# Patient Record
Sex: Male | Born: 1950 | Race: White | Hispanic: No | Marital: Married | Smoking: Never smoker
Health system: Southern US, Community
[De-identification: ages and names within clinical notes are randomized; demographics above are authoritative.]

## PROBLEM LIST (undated history)

## (undated) DIAGNOSIS — E7800 Pure hypercholesterolemia, unspecified: Secondary | ICD-10-CM

## (undated) DIAGNOSIS — N400 Enlarged prostate without lower urinary tract symptoms: Secondary | ICD-10-CM

## (undated) DIAGNOSIS — Z86018 Personal history of other benign neoplasm: Secondary | ICD-10-CM

## (undated) DIAGNOSIS — R972 Elevated prostate specific antigen [PSA]: Secondary | ICD-10-CM

## (undated) DIAGNOSIS — Z87442 Personal history of urinary calculi: Secondary | ICD-10-CM

## (undated) DIAGNOSIS — N289 Disorder of kidney and ureter, unspecified: Secondary | ICD-10-CM

## (undated) DIAGNOSIS — C801 Malignant (primary) neoplasm, unspecified: Secondary | ICD-10-CM

## (undated) HISTORY — PX: TONSILLECTOMY: SUR1361

## (undated) HISTORY — DX: Personal history of other benign neoplasm: Z86.018

## (undated) HISTORY — PX: COLONOSCOPY: SHX174

## (undated) HISTORY — PX: WISDOM TOOTH EXTRACTION: SHX21

## (undated) HISTORY — PX: PROSTATE BIOPSY: SHX241

---

## 1991-10-09 HISTORY — PX: BLADDER TUMOR EXCISION: SHX238

## 1998-06-24 ENCOUNTER — Encounter: Admission: RE | Admit: 1998-06-24 | Discharge: 1998-06-24 | Payer: Self-pay | Admitting: Sports Medicine

## 1998-07-11 ENCOUNTER — Encounter: Admission: RE | Admit: 1998-07-11 | Discharge: 1998-07-11 | Payer: Self-pay | Admitting: Sports Medicine

## 1998-08-08 ENCOUNTER — Encounter: Admission: RE | Admit: 1998-08-08 | Discharge: 1998-08-08 | Payer: Self-pay | Admitting: Sports Medicine

## 2004-04-27 ENCOUNTER — Emergency Department (HOSPITAL_COMMUNITY): Admission: EM | Admit: 2004-04-27 | Discharge: 2004-04-27 | Payer: Self-pay | Admitting: Emergency Medicine

## 2004-11-29 ENCOUNTER — Ambulatory Visit: Payer: Self-pay | Admitting: Gastroenterology

## 2005-07-17 ENCOUNTER — Ambulatory Visit: Payer: Self-pay | Admitting: Gastroenterology

## 2005-08-03 ENCOUNTER — Encounter (INDEPENDENT_AMBULATORY_CARE_PROVIDER_SITE_OTHER): Payer: Self-pay | Admitting: Specialist

## 2005-08-03 ENCOUNTER — Ambulatory Visit: Payer: Self-pay | Admitting: Gastroenterology

## 2008-11-23 ENCOUNTER — Encounter: Admission: RE | Admit: 2008-11-23 | Discharge: 2008-11-23 | Payer: Self-pay | Admitting: Orthopedic Surgery

## 2008-12-28 ENCOUNTER — Encounter: Admission: RE | Admit: 2008-12-28 | Discharge: 2008-12-28 | Payer: Self-pay | Admitting: Orthopedic Surgery

## 2009-01-04 ENCOUNTER — Encounter: Admission: RE | Admit: 2009-01-04 | Discharge: 2009-01-04 | Payer: Self-pay | Admitting: Orthopedic Surgery

## 2009-01-26 ENCOUNTER — Encounter: Admission: RE | Admit: 2009-01-26 | Discharge: 2009-01-26 | Payer: Self-pay | Admitting: Interventional Radiology

## 2009-07-06 ENCOUNTER — Encounter: Admission: RE | Admit: 2009-07-06 | Discharge: 2009-07-06 | Payer: Self-pay | Admitting: Interventional Radiology

## 2010-06-07 ENCOUNTER — Encounter (INDEPENDENT_AMBULATORY_CARE_PROVIDER_SITE_OTHER): Payer: Self-pay | Admitting: *Deleted

## 2010-11-09 NOTE — Letter (Signed)
Summary: Colonoscopy Letter  [facility]  [address] [place], [place] [address]   Phone: [phone]  Fax: [phone]      June 07, 2010 MRN: [id number]   Eddie Nunez [address] [place], [place]  [address]   Dear Mr. SEABROOKS,   According to your medical record, it is time for you to schedule a Colonoscopy. The American Cancer Society recommends this procedure as a method to detect early colon cancer. Patients with a family history of colon cancer, or a personal history of colon polyps or inflammatory bowel disease are at increased risk.  This letter has beeen generated based on the recommendations made at the time of your procedure. If you feel that in your particular situation this may no longer apply, please contact our office.  Please call our office at [phone] to schedule this appointment or to update your records at your earliest convenience.  Thank you for cooperating with [place] to provide you with the very best care possible.   Sincerely,  Judie Petit T. Russella Dar, M.D.  [facility] [phone]

## 2012-06-26 ENCOUNTER — Encounter: Payer: Self-pay | Admitting: Gastroenterology

## 2014-10-08 HISTORY — PX: COLONOSCOPY: SHX174

## 2014-10-08 HISTORY — PX: POLYPECTOMY: SHX149

## 2015-06-01 ENCOUNTER — Encounter: Payer: Self-pay | Admitting: Gastroenterology

## 2015-07-28 ENCOUNTER — Ambulatory Visit (AMBULATORY_SURGERY_CENTER): Payer: Self-pay | Admitting: *Deleted

## 2015-07-28 VITALS — Ht 72.0 in | Wt 191.0 lb

## 2015-07-28 DIAGNOSIS — Z86010 Personal history of colonic polyps: Secondary | ICD-10-CM

## 2015-07-28 MED ORDER — NA SULFATE-K SULFATE-MG SULF 17.5-3.13-1.6 GM/177ML PO SOLN: 1.0000 | Freq: Once | ORAL | Status: DC

## 2015-07-28 NOTE — Addendum Note (Signed)
Addended by: Dayton Bailiff D on: 07/28/2015 01:26 PM   Modules accepted: Orders

## 2015-07-28 NOTE — Progress Notes (Signed)
No egg or soy allergy. No anesthesia problems.  No home O2.  No diet meds.  

## 2015-07-29 ENCOUNTER — Encounter: Payer: Self-pay | Admitting: Gastroenterology

## 2015-07-29 ENCOUNTER — Telehealth: Payer: Self-pay | Admitting: Gastroenterology

## 2015-07-29 NOTE — Telephone Encounter (Signed)
Patient notified that there may be a problem with the server at the time he tired and that he should try at a later date and time.  I assured him that the website is active

## 2015-08-11 ENCOUNTER — Ambulatory Visit (AMBULATORY_SURGERY_CENTER): Admitting: Gastroenterology

## 2015-08-11 ENCOUNTER — Encounter: Payer: Self-pay | Admitting: Gastroenterology

## 2015-08-11 VITALS — BP 103/59 | HR 55 | Temp 96.2°F | Resp 12 | Ht 72.0 in | Wt 191.0 lb

## 2015-08-11 DIAGNOSIS — D123 Benign neoplasm of transverse colon: Secondary | ICD-10-CM

## 2015-08-11 DIAGNOSIS — D125 Benign neoplasm of sigmoid colon: Secondary | ICD-10-CM

## 2015-08-11 DIAGNOSIS — Z860100 Personal history of colon polyps, unspecified: Secondary | ICD-10-CM

## 2015-08-11 MED ORDER — SODIUM CHLORIDE 0.9 % IV SOLN: 500.0000 mL | INTRAVENOUS | Status: DC

## 2015-08-11 NOTE — Progress Notes (Signed)
A/ox3 pleased with MAC, report to Jane RN 

## 2015-08-11 NOTE — Progress Notes (Signed)
Called to room to assist during endoscopic procedure.  Patient ID and intended procedure confirmed with present staff. Received instructions for my participation in the procedure from the performing physician.  

## 2015-08-11 NOTE — Patient Instructions (Signed)
YOU HAD AN ENDOSCOPIC PROCEDURE TODAY AT THE [facility]:   Refer to the procedure report that was given to you for any specific questions about what was found during the examination.  If the procedure report does not answer your questions, please call your gastroenterologist to clarify.  If you requested that your care partner not be given the details of your procedure findings, then the procedure report has been included in a sealed envelope for you to review at your convenience later.  YOU SHOULD EXPECT: Some feelings of bloating in the abdomen. Passage of more gas than usual.  Walking can help get rid of the air that was put into your GI tract during the procedure and reduce the bloating. If you had a lower endoscopy (such as a colonoscopy or flexible sigmoidoscopy) you may notice spotting of blood in your stool or on the toilet paper. If you underwent a bowel prep for your procedure, you may not have a normal bowel movement for a few days.  Please Note:  You might notice some irritation and congestion in your nose or some drainage.  This is from the oxygen used during your procedure.  There is no need for concern and it should clear up in a day or so.  SYMPTOMS TO REPORT IMMEDIATELY:   Following lower endoscopy (colonoscopy or flexible sigmoidoscopy):  Excessive amounts of blood in the stool  Significant tenderness or worsening of abdominal pains  Swelling of the abdomen that is new, acute  Fever of 100F or higher   For urgent or emergent issues, a gastroenterologist can be reached at any hour by calling [phone].   DIET: Your first meal following the procedure should be a small meal and then it is ok to progress to your normal diet. Heavy or fried foods are harder to digest and may make you feel nauseous or bloated.  Likewise, meals heavy in dairy and vegetables can increase bloating.  Drink plenty of fluids but you should avoid alcoholic beverages for 24  hours.  ACTIVITY:  You should plan to take it easy for the rest of today and you should NOT DRIVE or use heavy machinery until tomorrow (because of the sedation medicines used during the test).    FOLLOW UP: Our staff will call the number listed on your records the next business day following your procedure to check on you and address any questions or concerns that you may have regarding the information given to you following your procedure. If we do not reach you, we will leave a message.  However, if you are feeling well and you are not experiencing any problems, there is no need to return our call.  We will assume that you have returned to your regular daily activities without incident.  If any biopsies were taken you will be contacted by phone or by letter within the next 1-3 weeks.  Please call us at [phone] if you have not heard about the biopsies in 3 weeks.    SIGNATURES/CONFIDENTIALITY: You and/or your care partner have signed paperwork which will be entered into your electronic medical record.  These signatures attest to the fact that that the information above on your After Visit Summary has been reviewed and is understood.  Full responsibility of the confidentiality of this discharge information lies with you and/or your care-partner.  Polyp and hemorrhoid information given. 

## 2015-08-11 NOTE — Op Note (Signed)
[facility]  [address] [place], [address]   COLONOSCOPY PROCEDURE REPORT  PATIENT: Eddie Nunez, Eddie Nunez  MR#: [id number] BIRTHDATE: 1950-12-08 , 59  yrs. old GENDER: male ENDOSCOPIST: Ladene Artist, MD, [facility] REFERRED BY:  Burnard Bunting, M.D. PROCEDURE DATE:  08/11/2015 PROCEDURE:   Colonoscopy, surveillance and Colonoscopy with snare polypectomy First Screening Colonoscopy - Avg.  risk and is 50 yrs.  old or older - No.  Prior Negative Screening - Now for repeat screening. N/A  History of Adenoma - Now for follow-up colonoscopy & has been > or = to 3 yrs.  Yes hx of adenoma.  Has been 3 or more years since last colonoscopy.  Polyps removed today? Yes ASA CLASS:   Class II INDICATIONS:Surveillance due to prior colonic neoplasia and PH Colon Adenoma. MEDICATIONS: Monitored anesthesia care and Propofol 250 mg IV DESCRIPTION OF PROCEDURE:   After the risks benefits and alternatives of the procedure were thoroughly explained, informed consent was obtained.  The digital rectal exam revealed no abnormalities of the rectum.   The [id number]  endoscope was introduced through the anus and advanced to the cecum, which was identified by both the appendix and ileocecal valve. No adverse events experienced.   The quality of the prep was excellent. (Suprep was used)  The instrument was then slowly withdrawn as the colon was fully examined. Estimated blood loss is zero unless otherwise noted in this procedure report.    COLON FINDINGS: Two sessile polyps measuring 6 mm in size were found in the sigmoid colon and transverse colon.  Polypectomies were performed with a cold snare.  The resection was complete, the polyp tissue was completely retrieved and sent to histology.   The examination was otherwise normal.  Retroflexed views revealed internal Grade I hemorrhoids. The time to cecum = 2.0 Withdrawal time = 11.6   The scope was withdrawn and the procedure  completed. COMPLICATIONS: There were no immediate complications.  ENDOSCOPIC IMPRESSION: 1.   Two sessile polyps in the sigmoid colon and transverse colon; polypectomies performed with a cold snare 2.   Grade I internal hemorrhoids  RECOMMENDATIONS: 1.  Await pathology results 2.  Repeat colonoscopy in 5 years if polyp(s) adenomatous; otherwise 10 years  eSigned:  Ladene Artist, MD, [facility] 08/11/2015 9:02 AM

## 2015-08-12 ENCOUNTER — Telehealth: Payer: Self-pay

## 2015-08-12 NOTE — Telephone Encounter (Signed)
  Follow up Call-  Call back number 08/11/2015  Post procedure Call Back phone  # [phone] hm  Permission to leave phone message Yes     Patient questions:  Do you have a fever, pain , or abdominal swelling? No. Pain Score  0 *  Have you tolerated food without any problems? Yes.    Have you been able to return to your normal activities? Yes.    Do you have any questions about your discharge instructions: Diet   No. Medications  No. Follow up visit  No.  Do you have questions or concerns about your Care? No.  Actions: * If pain score is 4 or above: No action needed, pain <4.

## 2015-08-18 ENCOUNTER — Encounter: Payer: Self-pay | Admitting: Gastroenterology

## 2016-05-28 DIAGNOSIS — Z0000 Encounter for general adult medical examination without abnormal findings: Secondary | ICD-10-CM | POA: Diagnosis not present

## 2016-05-28 DIAGNOSIS — Z125 Encounter for screening for malignant neoplasm of prostate: Secondary | ICD-10-CM | POA: Diagnosis not present

## 2016-05-28 DIAGNOSIS — E784 Other hyperlipidemia: Secondary | ICD-10-CM | POA: Diagnosis not present

## 2016-05-29 DIAGNOSIS — Z1212 Encounter for screening for malignant neoplasm of rectum: Secondary | ICD-10-CM | POA: Diagnosis not present

## 2016-05-30 DIAGNOSIS — Z6826 Body mass index (BMI) 26.0-26.9, adult: Secondary | ICD-10-CM | POA: Diagnosis not present

## 2016-05-30 DIAGNOSIS — Z0000 Encounter for general adult medical examination without abnormal findings: Secondary | ICD-10-CM | POA: Diagnosis not present

## 2016-05-30 DIAGNOSIS — E663 Overweight: Secondary | ICD-10-CM | POA: Diagnosis not present

## 2016-05-30 DIAGNOSIS — Z1389 Encounter for screening for other disorder: Secondary | ICD-10-CM | POA: Diagnosis not present

## 2016-05-30 DIAGNOSIS — E784 Other hyperlipidemia: Secondary | ICD-10-CM | POA: Diagnosis not present

## 2017-02-18 DIAGNOSIS — R05 Cough: Secondary | ICD-10-CM | POA: Diagnosis not present

## 2017-05-29 DIAGNOSIS — E784 Other hyperlipidemia: Secondary | ICD-10-CM | POA: Diagnosis not present

## 2017-05-29 DIAGNOSIS — R7989 Other specified abnormal findings of blood chemistry: Secondary | ICD-10-CM | POA: Diagnosis not present

## 2017-05-29 DIAGNOSIS — Z125 Encounter for screening for malignant neoplasm of prostate: Secondary | ICD-10-CM | POA: Diagnosis not present

## 2017-05-31 DIAGNOSIS — Z1212 Encounter for screening for malignant neoplasm of rectum: Secondary | ICD-10-CM | POA: Diagnosis not present

## 2017-06-05 DIAGNOSIS — Z23 Encounter for immunization: Secondary | ICD-10-CM | POA: Diagnosis not present

## 2017-06-05 DIAGNOSIS — E784 Other hyperlipidemia: Secondary | ICD-10-CM | POA: Diagnosis not present

## 2017-06-05 DIAGNOSIS — Z6827 Body mass index (BMI) 27.0-27.9, adult: Secondary | ICD-10-CM | POA: Diagnosis not present

## 2017-06-05 DIAGNOSIS — E663 Overweight: Secondary | ICD-10-CM | POA: Diagnosis not present

## 2017-06-05 DIAGNOSIS — Z0000 Encounter for general adult medical examination without abnormal findings: Secondary | ICD-10-CM | POA: Diagnosis not present

## 2017-11-11 DIAGNOSIS — N390 Urinary tract infection, site not specified: Secondary | ICD-10-CM | POA: Diagnosis not present

## 2017-11-11 DIAGNOSIS — R350 Frequency of micturition: Secondary | ICD-10-CM | POA: Diagnosis not present

## 2017-11-11 DIAGNOSIS — Z6828 Body mass index (BMI) 28.0-28.9, adult: Secondary | ICD-10-CM | POA: Diagnosis not present

## 2018-02-13 DIAGNOSIS — L821 Other seborrheic keratosis: Secondary | ICD-10-CM | POA: Diagnosis not present

## 2018-02-13 DIAGNOSIS — L720 Epidermal cyst: Secondary | ICD-10-CM | POA: Diagnosis not present

## 2018-06-04 DIAGNOSIS — R82998 Other abnormal findings in urine: Secondary | ICD-10-CM | POA: Diagnosis not present

## 2018-06-04 DIAGNOSIS — E7849 Other hyperlipidemia: Secondary | ICD-10-CM | POA: Diagnosis not present

## 2018-06-04 DIAGNOSIS — Z125 Encounter for screening for malignant neoplasm of prostate: Secondary | ICD-10-CM | POA: Diagnosis not present

## 2018-06-06 DIAGNOSIS — Z1212 Encounter for screening for malignant neoplasm of rectum: Secondary | ICD-10-CM | POA: Diagnosis not present

## 2018-06-11 DIAGNOSIS — Z1389 Encounter for screening for other disorder: Secondary | ICD-10-CM | POA: Diagnosis not present

## 2018-06-11 DIAGNOSIS — Z23 Encounter for immunization: Secondary | ICD-10-CM | POA: Diagnosis not present

## 2018-06-11 DIAGNOSIS — Z0000 Encounter for general adult medical examination without abnormal findings: Secondary | ICD-10-CM | POA: Diagnosis not present

## 2018-06-11 DIAGNOSIS — E785 Hyperlipidemia, unspecified: Secondary | ICD-10-CM | POA: Diagnosis not present

## 2018-06-11 DIAGNOSIS — E663 Overweight: Secondary | ICD-10-CM | POA: Diagnosis not present

## 2018-06-11 DIAGNOSIS — Z6828 Body mass index (BMI) 28.0-28.9, adult: Secondary | ICD-10-CM | POA: Diagnosis not present

## 2019-05-28 ENCOUNTER — Other Ambulatory Visit: Payer: Self-pay

## 2019-05-28 DIAGNOSIS — Z20822 Contact with and (suspected) exposure to covid-19: Secondary | ICD-10-CM

## 2019-05-28 DIAGNOSIS — R6889 Other general symptoms and signs: Secondary | ICD-10-CM | POA: Diagnosis not present

## 2019-05-29 LAB — NOVEL CORONAVIRUS, NAA: SARS-CoV-2, NAA: NOT DETECTED

## 2019-06-17 DIAGNOSIS — R82998 Other abnormal findings in urine: Secondary | ICD-10-CM | POA: Diagnosis not present

## 2019-06-17 DIAGNOSIS — Z125 Encounter for screening for malignant neoplasm of prostate: Secondary | ICD-10-CM | POA: Diagnosis not present

## 2019-06-17 DIAGNOSIS — E7849 Other hyperlipidemia: Secondary | ICD-10-CM | POA: Diagnosis not present

## 2019-06-24 DIAGNOSIS — Z0000 Encounter for general adult medical examination without abnormal findings: Secondary | ICD-10-CM | POA: Diagnosis not present

## 2019-06-24 DIAGNOSIS — E663 Overweight: Secondary | ICD-10-CM | POA: Diagnosis not present

## 2019-06-24 DIAGNOSIS — E785 Hyperlipidemia, unspecified: Secondary | ICD-10-CM | POA: Diagnosis not present

## 2019-10-30 ENCOUNTER — Ambulatory Visit: Payer: TRICARE For Life (TFL) | Attending: Internal Medicine

## 2019-10-30 DIAGNOSIS — Z23 Encounter for immunization: Secondary | ICD-10-CM

## 2019-10-30 NOTE — Progress Notes (Signed)
   Covid-19 Vaccination Clinic  Name:  Eddie Nunez    MRN: [id number] DOB: 02-28-1951  10/30/2019  [name] was observed post Covid-19 immunization for 15 minutes without incidence. He was provided with Vaccine Information Sheet and instruction to access the V-Safe system.   [name] was instructed to call 911 with any severe reactions post vaccine: Marland Kitchen Difficulty breathing  . Swelling of your face and throat  . A fast heartbeat  . A bad rash all over your body  . Dizziness and weakness    Immunizations Administered    Name Date Dose VIS Date Route   Pfizer COVID-19 Vaccine 10/30/2019  1:24 PM 0.3 mL 09/18/2019 Intramuscular   Manufacturer: [place]   Lot: [id number]   [place]: [id number]

## 2019-11-20 ENCOUNTER — Ambulatory Visit: Payer: Medicare Other | Attending: Internal Medicine

## 2019-11-20 DIAGNOSIS — Z23 Encounter for immunization: Secondary | ICD-10-CM

## 2019-11-20 NOTE — Progress Notes (Signed)
   Covid-19 Vaccination Clinic  Name:  Eddie Nunez    MRN: [id number] DOB: 02-May-1951  11/20/2019  Eddie Nunez was observed post Covid-19 immunization for 15 minutes without incidence. He was provided with Vaccine Information Sheet and instruction to access the V-Safe system.   Eddie Nunez was instructed to call 911 with any severe reactions post vaccine: Marland Kitchen Difficulty breathing  . Swelling of your face and throat  . A fast heartbeat  . A bad rash all over your body  . Dizziness and weakness    Immunizations Administered    Name Date Dose VIS Date Route   Pfizer COVID-19 Vaccine 11/20/2019 12:19 PM 0.3 mL 09/18/2019 Intramuscular   Manufacturer: [place]   Lot: [id number]   [place]: [id number]

## 2020-01-29 DIAGNOSIS — H2513 Age-related nuclear cataract, bilateral: Secondary | ICD-10-CM | POA: Diagnosis not present

## 2020-06-20 DIAGNOSIS — Z125 Encounter for screening for malignant neoplasm of prostate: Secondary | ICD-10-CM | POA: Diagnosis not present

## 2020-06-20 DIAGNOSIS — E785 Hyperlipidemia, unspecified: Secondary | ICD-10-CM | POA: Diagnosis not present

## 2020-06-20 DIAGNOSIS — Z1212 Encounter for screening for malignant neoplasm of rectum: Secondary | ICD-10-CM | POA: Diagnosis not present

## 2020-06-20 DIAGNOSIS — R82998 Other abnormal findings in urine: Secondary | ICD-10-CM | POA: Diagnosis not present

## 2020-06-29 ENCOUNTER — Encounter: Payer: Self-pay | Admitting: Gastroenterology

## 2020-06-29 DIAGNOSIS — E785 Hyperlipidemia, unspecified: Secondary | ICD-10-CM | POA: Diagnosis not present

## 2020-06-29 DIAGNOSIS — Z0000 Encounter for general adult medical examination without abnormal findings: Secondary | ICD-10-CM | POA: Diagnosis not present

## 2020-06-29 DIAGNOSIS — R972 Elevated prostate specific antigen [PSA]: Secondary | ICD-10-CM | POA: Diagnosis not present

## 2020-06-29 DIAGNOSIS — E663 Overweight: Secondary | ICD-10-CM | POA: Diagnosis not present

## 2020-07-06 DIAGNOSIS — L918 Other hypertrophic disorders of the skin: Secondary | ICD-10-CM | POA: Diagnosis not present

## 2020-07-12 DIAGNOSIS — Z23 Encounter for immunization: Secondary | ICD-10-CM | POA: Diagnosis not present

## 2020-10-14 DIAGNOSIS — R972 Elevated prostate specific antigen [PSA]: Secondary | ICD-10-CM | POA: Diagnosis not present

## 2020-10-14 DIAGNOSIS — E785 Hyperlipidemia, unspecified: Secondary | ICD-10-CM | POA: Diagnosis not present

## 2020-11-16 ENCOUNTER — Encounter: Payer: Self-pay | Admitting: Gastroenterology

## 2020-11-23 ENCOUNTER — Encounter: Payer: Self-pay | Admitting: Gastroenterology

## 2021-01-04 ENCOUNTER — Ambulatory Visit (AMBULATORY_SURGERY_CENTER): Payer: Self-pay

## 2021-01-04 ENCOUNTER — Other Ambulatory Visit: Payer: Self-pay

## 2021-01-04 VITALS — Ht 72.0 in | Wt 197.0 lb

## 2021-01-04 DIAGNOSIS — Z86010 Personal history of colonic polyps: Secondary | ICD-10-CM

## 2021-01-04 DIAGNOSIS — Z1211 Encounter for screening for malignant neoplasm of colon: Secondary | ICD-10-CM

## 2021-01-04 MED ORDER — NA SULFATE-K SULFATE-MG SULF 17.5-3.13-1.6 GM/177ML PO SOLN: 1.0000 | Freq: Once | ORAL | 0 refills | Status: AC

## 2021-01-04 NOTE — Progress Notes (Signed)
No egg or soy allergy known to patient  No issues with past sedation with any surgeries or procedures Patient denies ever being told they had issues or difficulty with intubation  No FH of Malignant Hyperthermia No diet pills per patient No home 02 use per patient  No blood thinners per patient  Pt denies issues with constipation  No A fib or A flutter  COVID 19 guidelines implemented in PV today with Pt and RN  Pt is fully vaccinated for Covid x 2; NO PA's for preps discussed with pt In PV today  Discussed with pt there will be an out-of-pocket cost for prep and that varies from $0 to 70 dollars  Due to the COVID-19 pandemic we are asking patients to follow certain guidelines.  Pt aware of COVID protocols and [facility] guidelines

## 2021-01-06 DIAGNOSIS — E785 Hyperlipidemia, unspecified: Secondary | ICD-10-CM | POA: Diagnosis not present

## 2021-01-27 ENCOUNTER — Encounter: Payer: Self-pay | Admitting: Gastroenterology

## 2021-01-27 ENCOUNTER — Ambulatory Visit (AMBULATORY_SURGERY_CENTER): Payer: Medicare Other | Admitting: Gastroenterology

## 2021-01-27 ENCOUNTER — Other Ambulatory Visit: Payer: Self-pay

## 2021-01-27 VITALS — BP 107/65 | HR 56 | Temp 97.7°F | Resp 15 | Ht 72.0 in | Wt 197.0 lb

## 2021-01-27 DIAGNOSIS — Z86010 Personal history of colonic polyps: Secondary | ICD-10-CM

## 2021-01-27 DIAGNOSIS — D124 Benign neoplasm of descending colon: Secondary | ICD-10-CM

## 2021-01-27 DIAGNOSIS — D123 Benign neoplasm of transverse colon: Secondary | ICD-10-CM | POA: Diagnosis not present

## 2021-01-27 DIAGNOSIS — D120 Benign neoplasm of cecum: Secondary | ICD-10-CM | POA: Diagnosis not present

## 2021-01-27 MED ORDER — SODIUM CHLORIDE 0.9 % IV SOLN: 500.0000 mL | Freq: Once | INTRAVENOUS | Status: DC

## 2021-01-27 NOTE — Progress Notes (Signed)
Called to room to assist during endoscopic procedure.  Patient ID and intended procedure confirmed with present staff. Received instructions for my participation in the procedure from the performing physician.  

## 2021-01-27 NOTE — Op Note (Signed)
[place] Patient Name: Eddie Nunez Procedure Date: 01/27/2021 8:36 AM MRN: [id number] Endoscopist: Ladene Artist , MD Age: 70 Referring MD:  Date of Birth: 12-18-1950 Gender: Male Account #: [id number] Procedure:                Colonoscopy Indications:              Surveillance: Personal history of adenomatous                            polyps on last colonoscopy > 5 years ago Medicines:                Monitored Anesthesia Care Procedure:                Pre-Anesthesia Assessment:                           - Prior to the procedure, a History and Physical                            was performed, and patient medications and                            allergies were reviewed. The patient's tolerance of                            previous anesthesia was also reviewed. The risks                            and benefits of the procedure and the sedation                            options and risks were discussed with the patient.                            All questions were answered, and informed consent                            was obtained. Prior Anticoagulants: The patient has                            taken no previous anticoagulant or antiplatelet                            agents. ASA Grade Assessment: II - A patient with                            mild systemic disease. After reviewing the risks                            and benefits, the patient was deemed in                            satisfactory condition to undergo the procedure.  After obtaining informed consent, the colonoscope                            was passed under direct vision. Throughout the                            procedure, the patient's blood pressure, pulse, and                            oxygen saturations were monitored continuously. The                            Colonoscope was introduced through the anus and                            advanced to the the cecum,  identified by                            appendiceal orifice and ileocecal valve. The                            ileocecal valve, appendiceal orifice, and rectum                            were photographed. The quality of the bowel                            preparation was excellent. The colonoscopy was                            performed without difficulty. The patient tolerated                            the procedure well. Scope In: 8:44:45 AM Scope Out: 8:59:24 AM Scope Withdrawal Time: 0 hours 13 minutes 2 seconds  Total Procedure Duration: 0 hours 14 minutes 39 seconds  Findings:                 The perianal and digital rectal examinations were                            normal.                           Six sessile polyps were found in the descending                            colon (2), transverse colon (3) and cecum (1). The                            polyps were 5 to 8 mm in size. These polyps were                            removed with a cold snare. Resection and retrieval  were complete.                           Internal hemorrhoids were found during                            retroflexion. The hemorrhoids were small and Grade                            I (internal hemorrhoids that do not prolapse).                           The exam was otherwise without abnormality on                            direct and retroflexion views. Complications:            No immediate complications. Estimated blood loss:                            None. Estimated Blood Loss:     Estimated blood loss: none. Impression:               - Six 5 to 8 mm polyps in the descending colon, in                            the transverse colon and in the cecum, removed with                            a cold snare. Resected and retrieved.                           - Internal hemorrhoids.                           - The examination was otherwise normal on direct                             and retroflexion views. Recommendation:           - Repeat colonoscopy after studies are complete for                            surveillance based on pathology results.                           - Patient has a contact number available for                            emergencies. The signs and symptoms of potential                            delayed complications were discussed with the                            patient. Return to normal activities tomorrow.  Written discharge instructions were provided to the                            patient.                           - Resume previous diet.                           - Continue present medications.                           - Await pathology results. Ladene Artist, MD 01/27/2021 9:04:02 AM This report has been signed electronically.

## 2021-01-27 NOTE — Progress Notes (Signed)
To PACU, VSS. Report to [email or web address] 

## 2021-01-27 NOTE — Patient Instructions (Signed)
Discharge instructions given. Handouts on polyps and hemorrhoids. Resume previous medications. YOU HAD AN ENDOSCOPIC PROCEDURE TODAY AT THE [facility]:   Refer to the procedure report that was given to you for any specific questions about what was found during the examination.  If the procedure report does not answer your questions, please call your gastroenterologist to clarify.  If you requested that your care partner not be given the details of your procedure findings, then the procedure report has been included in a sealed envelope for you to review at your convenience later.  YOU SHOULD EXPECT: Some feelings of bloating in the abdomen. Passage of more gas than usual.  Walking can help get rid of the air that was put into your GI tract during the procedure and reduce the bloating. If you had a lower endoscopy (such as a colonoscopy or flexible sigmoidoscopy) you may notice spotting of blood in your stool or on the toilet paper. If you underwent a bowel prep for your procedure, you may not have a normal bowel movement for a few days.  Please Note:  You might notice some irritation and congestion in your nose or some drainage.  This is from the oxygen used during your procedure.  There is no need for concern and it should clear up in a day or so.  SYMPTOMS TO REPORT IMMEDIATELY:  Following lower endoscopy (colonoscopy or flexible sigmoidoscopy):  Excessive amounts of blood in the stool  Significant tenderness or worsening of abdominal pains  Swelling of the abdomen that is new, acute  Fever of 100F or higher   For urgent or emergent issues, a gastroenterologist can be reached at any hour by calling [phone]. Do not use MyChart messaging for urgent concerns.    DIET:  We do recommend a small meal at first, but then you may proceed to your regular diet.  Drink plenty of fluids but you should avoid alcoholic beverages for 24 hours.  ACTIVITY:  You should plan to take it  easy for the rest of today and you should NOT DRIVE or use heavy machinery until tomorrow (because of the sedation medicines used during the test).    FOLLOW UP: Our staff will call the number listed on your records 48-72 hours following your procedure to check on you and address any questions or concerns that you may have regarding the information given to you following your procedure. If we do not reach you, we will leave a message.  We will attempt to reach you two times.  During this call, we will ask if you have developed any symptoms of COVID 19. If you develop any symptoms (ie: fever, flu-like symptoms, shortness of breath, cough etc.) before then, please call [phone].  If you test positive for Covid 19 in the 2 weeks post procedure, please call and report this information to us.    If any biopsies were taken you will be contacted by phone or by letter within the next 1-3 weeks.  Please call us at [phone] if you have not heard about the biopsies in 3 weeks.    SIGNATURES/CONFIDENTIALITY: You and/or your care partner have signed paperwork which will be entered into your electronic medical record.  These signatures attest to the fact that that the information above on your After Visit Summary has been reviewed and is understood.  Full responsibility of the confidentiality of this discharge information lies with you and/or your care-partner.  

## 2021-01-27 NOTE — Progress Notes (Signed)
VS by CW  Pt's states no medical or surgical changes since previsit or office visit.  

## 2021-01-31 ENCOUNTER — Telehealth: Payer: Self-pay | Admitting: *Deleted

## 2021-01-31 NOTE — Telephone Encounter (Signed)
  Follow up Call-  Call back number 01/27/2021  Post procedure Call Back phone  # [phone]  Permission to leave phone message Yes  Some recent data might be hidden     Patient questions:  Do you have a fever, pain , or abdominal swelling? No. Pain Score  0 *  Have you tolerated food without any problems? Yes.    Have you been able to return to your normal activities? Yes.    Do you have any questions about your discharge instructions: Diet   No. Medications  No. Follow up visit  No.  Do you have questions or concerns about your Care? No.  Actions: * If pain score is 4 or above: No action needed, pain <4.  1. Have you developed a fever since your procedure? no  2.   Have you had an respiratory symptoms (SOB or cough) since your procedure? no  3.   Have you tested positive for COVID 19 since your procedure no  4.   Have you had any family members/close contacts diagnosed with the COVID 19 since your procedure?  no   If yes to any of these questions please route to Joylene John, RN and Joella Prince, RN

## 2021-02-06 ENCOUNTER — Encounter: Payer: Self-pay | Admitting: Gastroenterology

## 2021-02-23 DIAGNOSIS — E785 Hyperlipidemia, unspecified: Secondary | ICD-10-CM | POA: Diagnosis not present

## 2021-05-11 DIAGNOSIS — M25562 Pain in left knee: Secondary | ICD-10-CM | POA: Diagnosis not present

## 2021-06-29 DIAGNOSIS — Z125 Encounter for screening for malignant neoplasm of prostate: Secondary | ICD-10-CM | POA: Diagnosis not present

## 2021-06-29 DIAGNOSIS — E785 Hyperlipidemia, unspecified: Secondary | ICD-10-CM | POA: Diagnosis not present

## 2021-06-29 DIAGNOSIS — Z1212 Encounter for screening for malignant neoplasm of rectum: Secondary | ICD-10-CM | POA: Diagnosis not present

## 2021-06-29 DIAGNOSIS — R82998 Other abnormal findings in urine: Secondary | ICD-10-CM | POA: Diagnosis not present

## 2021-07-05 DIAGNOSIS — R972 Elevated prostate specific antigen [PSA]: Secondary | ICD-10-CM | POA: Diagnosis not present

## 2021-07-05 DIAGNOSIS — E663 Overweight: Secondary | ICD-10-CM | POA: Diagnosis not present

## 2021-07-05 DIAGNOSIS — Z1331 Encounter for screening for depression: Secondary | ICD-10-CM | POA: Diagnosis not present

## 2021-07-05 DIAGNOSIS — E785 Hyperlipidemia, unspecified: Secondary | ICD-10-CM | POA: Diagnosis not present

## 2021-07-05 DIAGNOSIS — Z0000 Encounter for general adult medical examination without abnormal findings: Secondary | ICD-10-CM | POA: Diagnosis not present

## 2021-07-05 DIAGNOSIS — Z1339 Encounter for screening examination for other mental health and behavioral disorders: Secondary | ICD-10-CM | POA: Diagnosis not present

## 2021-07-12 ENCOUNTER — Other Ambulatory Visit: Payer: Self-pay | Admitting: Internal Medicine

## 2021-07-12 DIAGNOSIS — E785 Hyperlipidemia, unspecified: Secondary | ICD-10-CM

## 2021-08-03 ENCOUNTER — Other Ambulatory Visit: Payer: Medicare Other

## 2021-08-04 ENCOUNTER — Ambulatory Visit
Admission: RE | Admit: 2021-08-04 | Discharge: 2021-08-04 | Disposition: A | Discharge status: Home / Self Care | Payer: No Typology Code available for payment source | Source: Ambulatory Visit | Attending: Internal Medicine | Admitting: Internal Medicine

## 2021-08-04 DIAGNOSIS — E785 Hyperlipidemia, unspecified: Secondary | ICD-10-CM

## 2021-08-21 DIAGNOSIS — R972 Elevated prostate specific antigen [PSA]: Secondary | ICD-10-CM | POA: Diagnosis not present

## 2021-08-21 DIAGNOSIS — N400 Enlarged prostate without lower urinary tract symptoms: Secondary | ICD-10-CM | POA: Diagnosis not present

## 2021-08-23 DIAGNOSIS — R972 Elevated prostate specific antigen [PSA]: Secondary | ICD-10-CM | POA: Diagnosis not present

## 2021-09-12 ENCOUNTER — Other Ambulatory Visit: Payer: Self-pay | Admitting: Urology

## 2021-09-12 DIAGNOSIS — R972 Elevated prostate specific antigen [PSA]: Secondary | ICD-10-CM

## 2021-09-14 DIAGNOSIS — E785 Hyperlipidemia, unspecified: Secondary | ICD-10-CM | POA: Diagnosis not present

## 2021-10-14 ENCOUNTER — Other Ambulatory Visit: Payer: Self-pay

## 2021-10-14 ENCOUNTER — Ambulatory Visit
Admission: RE | Admit: 2021-10-14 | Discharge: 2021-10-14 | Disposition: A | Discharge status: Home / Self Care | Payer: Medicare Other | Source: Ambulatory Visit | Attending: Urology | Admitting: Urology

## 2021-10-14 DIAGNOSIS — R972 Elevated prostate specific antigen [PSA]: Secondary | ICD-10-CM

## 2021-10-14 DIAGNOSIS — N400 Enlarged prostate without lower urinary tract symptoms: Secondary | ICD-10-CM | POA: Diagnosis not present

## 2021-10-14 MED ORDER — GADOBENATE DIMEGLUMINE 529 MG/ML IV SOLN
18.0000 mL | Freq: Once | INTRAVENOUS | Status: AC | PRN
  Administered 2021-10-14: 18 mL via INTRAVENOUS

## 2021-12-18 DIAGNOSIS — R972 Elevated prostate specific antigen [PSA]: Secondary | ICD-10-CM | POA: Diagnosis not present

## 2021-12-18 DIAGNOSIS — N400 Enlarged prostate without lower urinary tract symptoms: Secondary | ICD-10-CM | POA: Diagnosis not present

## 2022-04-26 IMAGING — MR MR PROSTATE WO/W CM
12 series · 48 of 48 positions shown · IV contrast (multihance)
Comparison: None.

CLINICAL DATA: Elevated PSA.  70-year-old male.  PSA equal

EXAM:
MR PROSTATE WITHOUT AND WITH CONTRAST
TECHNIQUE: Multiplanar multisequence MRI images were obtained of the pelvis
centered about the prostate. Pre and post contrast images were
obtained.
CONTRAST:  18mL MULTIHANCE GADOBENATE DIMEGLUMINE 529 MG/ML IV SOLN

[Series 3: T2 · coronal · 3.0mm · 0.56mm/px · 1 of 23 slices shown (1 of 3)]
[im 1/23]
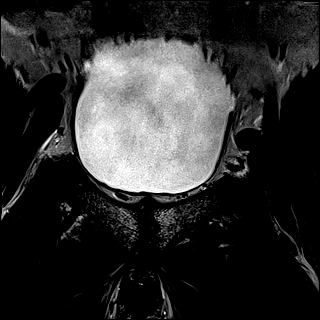

[Series 4: T1 · axial · 5.0mm · 1.25mm/px · z∈[-16,+179]mm · 2 of 80 slices shown]
[im 1/80]
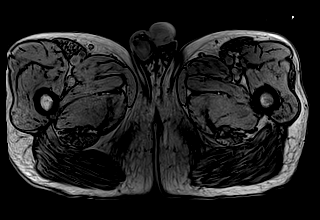
[im 80/80]
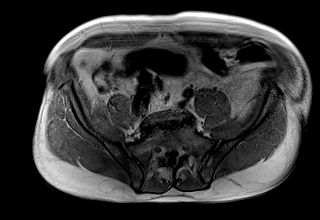

[Series 5: DWI · axial · 3.0mm · 1.75mm/px · z∈[+42,+105]mm · 2 of 66 slices shown (1 of 3)]
[im 1/66]
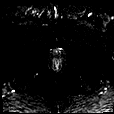
[im 66/66]
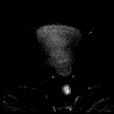

[Series 6: DWI · axial · 3.0mm · 1.75mm/px · 1 of 22 slices shown (2 of 3)]
[im 1/22]
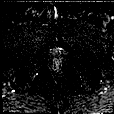

[Series 7: DWI · axial · 3.0mm · 1.75mm/px · 1 of 22 slices shown (3 of 3)]
[im 1/22]
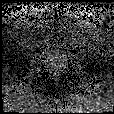

[Series 8: T2 · axial · 3.0mm · 0.56mm/px · 1 of 22 slices shown (2 of 3)]
[im 1/22]
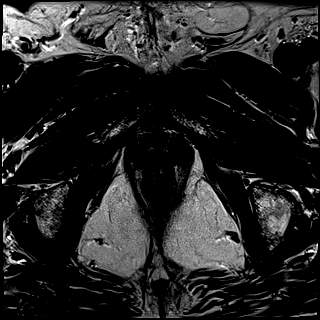

[Series 9: T2 · axial · 1.0mm · 1.04mm/px · z∈[+34,+113]mm · 2 of 80 slices shown (3 of 3)]
[im 1/80]
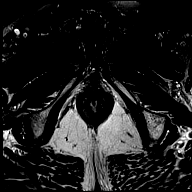
[im 80/80]
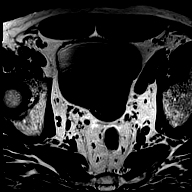

[Series 10: pre t1_twist_tra_dyn · axial · non-contrast · 3.5mm · 0.83mm/px · 1 of 20 slices shown]
[im 1/20]
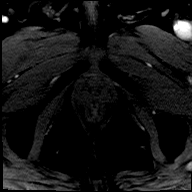

[Series 11: post t1_twist_tra_dyn-copy center · axial · non-contrast · 3.5mm · 0.83mm/px · z∈[+40,+107]mm · 17 of 600 slices shown]
[im 1/600]
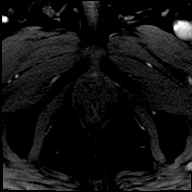
[im 38/600]
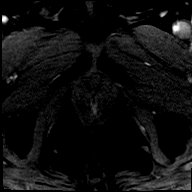
[im 75/600]
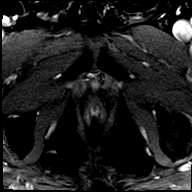
[im 113/600]
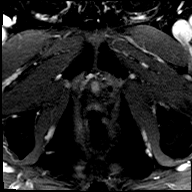
[im 150/600]
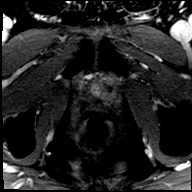
[im 188/600]
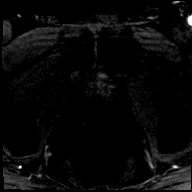
[im 225/600]
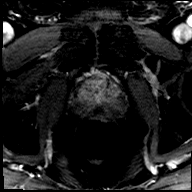
[im 263/600]
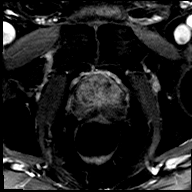
[im 300/600]
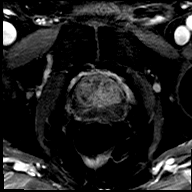
[im 337/600]
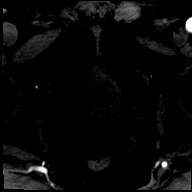
[im 375/600]
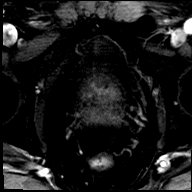
[im 412/600]
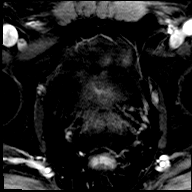
[im 450/600]
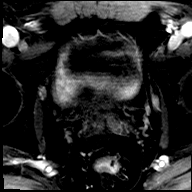
[im 487/600]
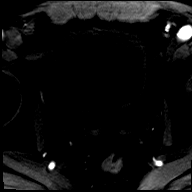
[im 525/600]
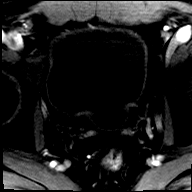
[im 562/600]
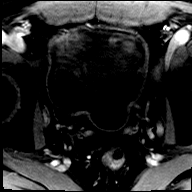
[im 600/600]
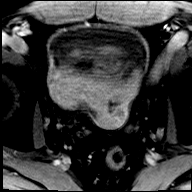

[Series 12: post t1_twist_tra_dyn-copy cent_sub · axial · 3.5mm · 0.83mm/px · z∈[+40,+107]mm · 16 of 571 slices shown]
[im 1/571]
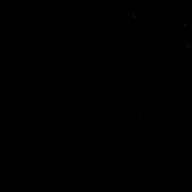
[im 39/571]
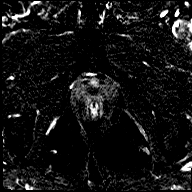
[im 77/571]
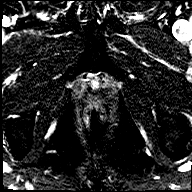
[im 115/571]
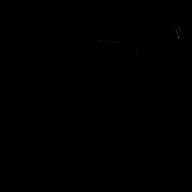
[im 153/571]
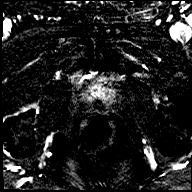
[im 191/571]
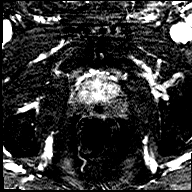
[im 229/571]
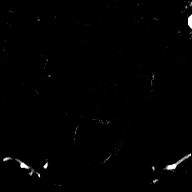
[im 267/571]
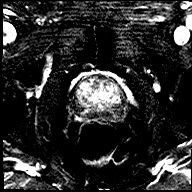
[im 305/571]
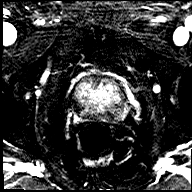
[im 343/571]
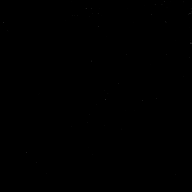
[im 381/571]
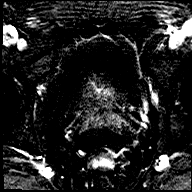
[im 419/571]
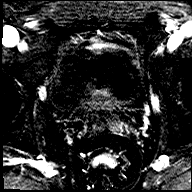
[im 457/571]
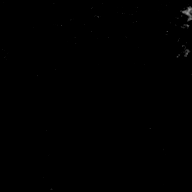
[im 495/571]
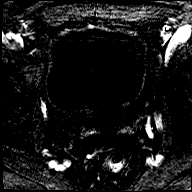
[im 533/571]
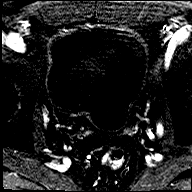
[im 571/571]
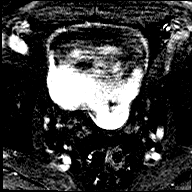

[Series 13: t1_vibe_dixon_tra_f · axial · 2.5mm · 0.91mm/px · z∈[-17,+180]mm · 2 of 80 slices shown]
[im 1/80]
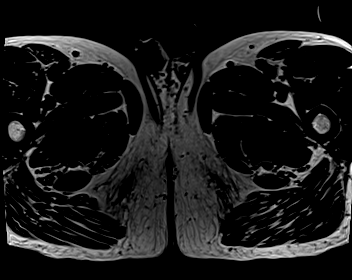
[im 80/80]
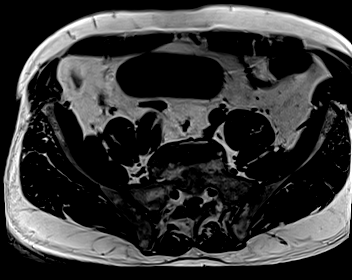

[Series 14: t1_vibe_dixon_tra_w · axial · 2.5mm · 0.91mm/px · z∈[-17,+180]mm · 2 of 80 slices shown]
[im 1/80]
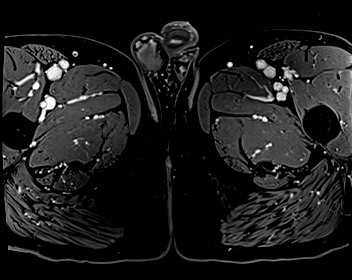
[im 80/80]
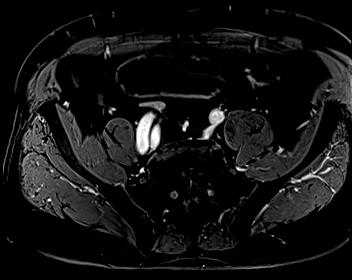

[48 of 48 positions shown; findings below may reference images not displayed]

FINDINGS: Prostate: No foci of restricted diffusion within the peripheral zone
(series 6 and series 7). The are linear striations within peripheral
zone on T2 weighted imaging. No suspicious focal lesion on T2
weighted imaging within the peripheral zone.

The transitional zone is minimally enlarged by capsulated nodules.

Postcontrast enhanced imaging demonstrates no suspicious enhancement
pattern.

Prostatic capsule is intact.  Seminal vesicles are normal.

Volume: 5.0 x 3.8 x 3.3 cm (volume = 33 cm^3)

Transcapsular spread:  Absent

Seminal vesicle involvement: Absent

Neurovascular bundle involvement: Absent

Pelvic adenopathy: Absent

Bone metastasis: Absent

Other findings: Wide-mouth bladder diverticulum along the posterior
margin of bladder.
IMPRESSION: 1. No high-grade carcinoma identified in the peripheral zone. Linear
striations favored benign and may relate to prior biopsy or prior
inflammation. PI-RADS: 2
2. Minimally enlarged nodular transitional zone most consistent with
benign prostate hypertrophy. PI-RADS: 2

## 2022-05-07 DIAGNOSIS — N200 Calculus of kidney: Secondary | ICD-10-CM | POA: Diagnosis not present

## 2022-06-12 DIAGNOSIS — R972 Elevated prostate specific antigen [PSA]: Secondary | ICD-10-CM | POA: Diagnosis not present

## 2022-06-15 DIAGNOSIS — N400 Enlarged prostate without lower urinary tract symptoms: Secondary | ICD-10-CM | POA: Diagnosis not present

## 2022-06-15 DIAGNOSIS — R972 Elevated prostate specific antigen [PSA]: Secondary | ICD-10-CM | POA: Diagnosis not present

## 2022-06-22 DIAGNOSIS — E785 Hyperlipidemia, unspecified: Secondary | ICD-10-CM | POA: Diagnosis not present

## 2022-06-22 DIAGNOSIS — Z125 Encounter for screening for malignant neoplasm of prostate: Secondary | ICD-10-CM | POA: Diagnosis not present

## 2022-06-22 DIAGNOSIS — R7989 Other specified abnormal findings of blood chemistry: Secondary | ICD-10-CM | POA: Diagnosis not present

## 2022-06-22 DIAGNOSIS — R82998 Other abnormal findings in urine: Secondary | ICD-10-CM | POA: Diagnosis not present

## 2022-07-09 DIAGNOSIS — Z23 Encounter for immunization: Secondary | ICD-10-CM | POA: Diagnosis not present

## 2022-07-09 DIAGNOSIS — E785 Hyperlipidemia, unspecified: Secondary | ICD-10-CM | POA: Diagnosis not present

## 2022-07-09 DIAGNOSIS — E663 Overweight: Secondary | ICD-10-CM | POA: Diagnosis not present

## 2022-07-09 DIAGNOSIS — Z0000 Encounter for general adult medical examination without abnormal findings: Secondary | ICD-10-CM | POA: Diagnosis not present

## 2022-07-28 DIAGNOSIS — N200 Calculus of kidney: Secondary | ICD-10-CM | POA: Diagnosis not present

## 2022-10-05 DIAGNOSIS — H43811 Vitreous degeneration, right eye: Secondary | ICD-10-CM | POA: Diagnosis not present

## 2022-12-10 DIAGNOSIS — R972 Elevated prostate specific antigen [PSA]: Secondary | ICD-10-CM | POA: Diagnosis not present

## 2022-12-17 DIAGNOSIS — R972 Elevated prostate specific antigen [PSA]: Secondary | ICD-10-CM | POA: Diagnosis not present

## 2023-01-21 DIAGNOSIS — E785 Hyperlipidemia, unspecified: Secondary | ICD-10-CM | POA: Diagnosis not present

## 2023-01-21 DIAGNOSIS — Z524 Kidney donor: Secondary | ICD-10-CM | POA: Diagnosis not present

## 2023-02-06 DIAGNOSIS — Z524 Kidney donor: Secondary | ICD-10-CM | POA: Diagnosis not present

## 2023-02-07 DIAGNOSIS — Z524 Kidney donor: Secondary | ICD-10-CM | POA: Diagnosis not present

## 2023-03-19 DIAGNOSIS — R972 Elevated prostate specific antigen [PSA]: Secondary | ICD-10-CM | POA: Diagnosis not present

## 2023-03-21 DIAGNOSIS — E785 Hyperlipidemia, unspecified: Secondary | ICD-10-CM | POA: Diagnosis not present

## 2023-03-21 DIAGNOSIS — R7989 Other specified abnormal findings of blood chemistry: Secondary | ICD-10-CM | POA: Diagnosis not present

## 2023-06-12 DIAGNOSIS — R972 Elevated prostate specific antigen [PSA]: Secondary | ICD-10-CM | POA: Diagnosis not present

## 2023-06-12 DIAGNOSIS — C61 Malignant neoplasm of prostate: Secondary | ICD-10-CM | POA: Diagnosis not present

## 2023-06-12 DIAGNOSIS — D075 Carcinoma in situ of prostate: Secondary | ICD-10-CM | POA: Diagnosis not present

## 2023-06-18 DIAGNOSIS — R972 Elevated prostate specific antigen [PSA]: Secondary | ICD-10-CM | POA: Diagnosis not present

## 2023-06-19 ENCOUNTER — Other Ambulatory Visit (HOSPITAL_COMMUNITY): Payer: Self-pay | Admitting: Urology

## 2023-06-19 DIAGNOSIS — C61 Malignant neoplasm of prostate: Secondary | ICD-10-CM

## 2023-07-01 ENCOUNTER — Encounter (HOSPITAL_COMMUNITY)
Admission: RE | Admit: 2023-07-01 | Discharge: 2023-07-01 | Disposition: A | Discharge status: Home / Self Care | Payer: Medicare Other | Source: Ambulatory Visit | Attending: Urology | Admitting: Urology

## 2023-07-01 DIAGNOSIS — C61 Malignant neoplasm of prostate: Secondary | ICD-10-CM | POA: Diagnosis not present

## 2023-07-01 MED ORDER — FLOTUFOLASTAT F 18 GALLIUM 296-5846 MBQ/ML IV SOLN
8.4410 | Freq: Once | INTRAVENOUS | Status: AC
  Administered 2023-07-01: 8.441 via INTRAVENOUS

## 2023-08-09 DIAGNOSIS — Z1389 Encounter for screening for other disorder: Secondary | ICD-10-CM | POA: Diagnosis not present

## 2023-08-09 DIAGNOSIS — E785 Hyperlipidemia, unspecified: Secondary | ICD-10-CM | POA: Diagnosis not present

## 2023-08-09 DIAGNOSIS — R972 Elevated prostate specific antigen [PSA]: Secondary | ICD-10-CM | POA: Diagnosis not present

## 2023-08-12 DIAGNOSIS — C61 Malignant neoplasm of prostate: Secondary | ICD-10-CM | POA: Diagnosis not present

## 2023-08-12 DIAGNOSIS — Z0000 Encounter for general adult medical examination without abnormal findings: Secondary | ICD-10-CM | POA: Diagnosis not present

## 2023-08-12 DIAGNOSIS — R972 Elevated prostate specific antigen [PSA]: Secondary | ICD-10-CM | POA: Diagnosis not present

## 2023-08-12 DIAGNOSIS — E663 Overweight: Secondary | ICD-10-CM | POA: Diagnosis not present

## 2023-08-12 DIAGNOSIS — Z524 Kidney donor: Secondary | ICD-10-CM | POA: Diagnosis not present

## 2023-08-12 DIAGNOSIS — Z1331 Encounter for screening for depression: Secondary | ICD-10-CM | POA: Diagnosis not present

## 2023-08-12 DIAGNOSIS — E785 Hyperlipidemia, unspecified: Secondary | ICD-10-CM | POA: Diagnosis not present

## 2023-08-12 DIAGNOSIS — R82998 Other abnormal findings in urine: Secondary | ICD-10-CM | POA: Diagnosis not present

## 2023-08-12 DIAGNOSIS — Z1339 Encounter for screening examination for other mental health and behavioral disorders: Secondary | ICD-10-CM | POA: Diagnosis not present

## 2023-08-14 DIAGNOSIS — C61 Malignant neoplasm of prostate: Secondary | ICD-10-CM | POA: Diagnosis not present

## 2023-08-19 ENCOUNTER — Encounter: Payer: Self-pay | Admitting: Radiation Oncology

## 2023-08-19 NOTE — Progress Notes (Signed)
GU Location of Tumor / Histology: Prostate Ca  If Prostate Cancer, Gleason Score is (3 + 4) and PSA is (13.50 on 06/19/2023)  Biopsy    07/01/2023 Dr. Bjorn Pippin NM PET (PSMA) Skull to Mid Thigh CLINICAL DATA: Prostate carcinoma with biochemical recurrence.   IMPRESSION: 1. Two foci of radiotracer activity in the RIGHT lobe of the prostate gland suspicious for primary prostate adenocarcinoma. 2. No evidence of metastatic adenopathy in the pelvis or periaortic retroperitoneum. 3. No evidence of visceral metastasis or skeletal metastasis. 4. Post LEFT nephrectomy.  10/14/2021 Dr. Bjorn Pippin MR Prostate with/without Contrast CLINICAL DATA: Elevated PSA. 72 year old male. PSA equal 4.5   IMPRESSION: 1. No high-grade carcinoma identified in the peripheral zone. Linear striations favored benign and may relate to prior biopsy or prior inflammation. PI-RADS: 2 2. Minimally enlarged nodular transitional zone most consistent with benign prostate hypertrophy. PI-RADS: 2  Past/Anticipated interventions by urology, if any: NA  Past/Anticipated interventions by medical oncology, if any: NA  Weight changes, if any:  No  IPSS:  7 SHIM:  17  Bowel/Bladder complaints, if any:  No  Nausea/Vomiting, if any: No  Pain issues, if any:  0/10  SAFETY ISSUES: Prior radiation? No Pacemaker/ICD? No Possible current pregnancy? Male Is the patient on methotrexate?  No  Current Complaints / other details:

## 2023-08-22 DIAGNOSIS — C61 Malignant neoplasm of prostate: Secondary | ICD-10-CM | POA: Insufficient documentation

## 2023-08-23 ENCOUNTER — Ambulatory Visit
Admission: RE | Admit: 2023-08-23 | Discharge: 2023-08-23 | Disposition: A | Discharge status: Home / Self Care | Payer: Medicare Other | Source: Ambulatory Visit | Attending: Radiation Oncology | Admitting: Radiation Oncology

## 2023-08-23 ENCOUNTER — Encounter: Payer: Self-pay | Admitting: Radiation Oncology

## 2023-08-23 VITALS — BP 142/87 | HR 59 | Temp 97.1°F | Resp 18 | Ht 70.75 in | Wt 202.1 lb

## 2023-08-23 DIAGNOSIS — C61 Malignant neoplasm of prostate: Secondary | ICD-10-CM

## 2023-08-23 DIAGNOSIS — Z191 Hormone sensitive malignancy status: Secondary | ICD-10-CM | POA: Diagnosis not present

## 2023-08-23 HISTORY — DX: Elevated prostate specific antigen (PSA): R97.20

## 2023-08-23 HISTORY — DX: Pure hypercholesterolemia, unspecified: E78.00

## 2023-08-23 HISTORY — DX: Disorder of kidney and ureter, unspecified: N28.9

## 2023-08-23 MED ORDER — SILDENAFIL CITRATE 100 MG PO TABS
50.0000 mg | ORAL_TABLET | Freq: Every day | ORAL | 2 refills | Status: AC | PRN
Start: 2023-08-23 — End: 2024-03-12

## 2023-08-23 NOTE — Progress Notes (Signed)
Radiation Oncology         [phone] ________________________________  Initial Outpatient Consultation  Name: Eddie Nunez MRN: [id number]  Date: 08/23/2023  DOB: 03/24/1951  [id number], Gerlene Burdock, MD  Bjorn Pippin, MD   REFERRING PHYSICIAN: Bjorn Pippin, MD  DIAGNOSIS: 72 y.o. gentleman with Stage T1c adenocarcinoma of the prostate with Gleason score of 4+3, and PSA of 6.60.    ICD-10-CM   1. Malignant neoplasm of prostate (HCC)  C61       HISTORY OF PRESENT ILLNESS: Eddie Nunez is a 72 y.o. male with a diagnosis of prostate cancer. He has a history of elevated PSA since at least 2021 and this has been fluctuating.  His PSA was 4.5 in September 2022 on routine labs with his primary care physician, Dr. Jacky Kindle.  Accordingly, he was referred for evaluation in urology by Dr. Annabell Howells on 08/21/2021,  digital rectal examination performed at that time showed no discrete nodules or concerning findings.  He had a prostate MRI on 10/14/2021 that was without any evidence of high-grade lesions.  Continued monitoring of his PSA showed a persistent rise at 4.05 in March 2023, 4.35 in September 2023 and 5.53 in March 2024.  At that time, it was recommended to proceed with prostate biopsy but he elected to continue to monitor the PSA since he was scheduled for a donor nephrectomy for a fellow veteran.  He had a repeat PSA on 03/20/2023 that was further elevated at 6.92.  Therefore, the patient proceeded to transrectal ultrasound with 12 biopsies of the prostate on 06/12/2023.  A repeat PSA that day was stable at 6.60. The prostate volume measured 35.5 cc.  Out of 12 core biopsies, 9 were positive.  The maximum Gleason score was 4+3, and this was seen in the right mid lateral.  Additionally, Gleason 3+4 was seen in the left mid lateral and left apex lateral and Gleason 3+3 in the left base lateral, left mid, left apex, right mid, right apex and right apex lateral.  He did have a repeat PSA on 06/19/2023 that was  13.50 but not felt to be reliable since he had just recently had prostate biopsy.  A PSMA PET scan was performed on 07/01/2023 to complete his disease staging and was without any evidence of metastatic disease.  The patient reviewed the biopsy and imaging results with his urologist and he has kindly been referred today for discussion of potential radiation treatment options.   PREVIOUS RADIATION THERAPY: No  PAST MEDICAL HISTORY:  Past Medical History:  Diagnosis Date   Elevated PSA    History of benign bladder tumor       PAST SURGICAL HISTORY: Past Surgical History:  Procedure Laterality Date   BLADDER TUMOR EXCISION  1993   COLONOSCOPY  2016   MS-MAC-suprep(Exc)-TA x2   POLYPECTOMY  2016   TA x 2   PROSTATE BIOPSY     TONSILLECTOMY     WISDOM TOOTH EXTRACTION      FAMILY HISTORY:  Family History  Problem Relation Age of Onset   Colon cancer Neg Hx    Esophageal cancer Neg Hx    Rectal cancer Neg Hx    Stomach cancer Neg Hx    Colon polyps Neg Hx     SOCIAL HISTORY:  Social History   Socioeconomic History   Marital status: Married    Spouse name: Not on file   Number of children: Not on file   Years of education: Not on file  Highest education level: Not on file  Occupational History   Not on file  Tobacco Use   Smoking status: Never   Smokeless tobacco: Never  Vaping Use   Vaping status: Never Used  Substance and Sexual Activity   Alcohol use: Yes    Alcohol/week: 21.0 standard drinks of alcohol    Types: 21 Standard drinks or equivalent per week    Comment: 3 per day   Drug use: No   Sexual activity: Not on file  Other Topics Concern   Not on file  Social History Narrative   Not on file   Social Determinants of Health   Financial Resource Strain: Not on file  Food Insecurity: Low Risk  (02/06/2023)   Received from [facility]   Food Insecurity    Within the past 12 months, the food you bought just didn't last and you didn't have money  to get more.: Never true    Within the past 12 months, you worried that your food would run out before you got money to buy more.: Never true  Transportation Needs: Not on file  Physical Activity: Not on file  Stress: Not on file  Social Connections: Not on file  Intimate Partner Violence: Not on file    ALLERGIES: Patient has no known allergies.  MEDICATIONS:  Current Outpatient Medications  Medication Sig Dispense Refill   zolpidem (AMBIEN) 5 MG tablet Take 5 mg by mouth at bedtime as needed.     DENTA 5000 PLUS 1.1 % CREA dental cream daily.     rosuvastatin (CRESTOR) 10 MG tablet TAKE ONE TABLET EACH DAY for 30     No current facility-administered medications for this encounter.    REVIEW OF SYSTEMS:  On review of systems, the patient reports that he is doing well overall. He denies any chest pain, shortness of breath, cough, fevers, chills, night sweats, unintended weight changes. He denies any bowel disturbances, and denies abdominal pain, nausea or vomiting. He denies any new musculoskeletal or joint aches or pains. His IPSS was 7, indicating mild urinary symptoms. His SHIM was 17, indicating he has mild erectile dysfunction. A complete review of systems is obtained and is otherwise negative.    PHYSICAL EXAM:  Wt Readings from Last 3 Encounters:  01/27/21 197 lb (89.4 kg)  01/04/21 197 lb (89.4 kg)  08/11/15 191 lb (86.6 kg)   Temp Readings from Last 3 Encounters:  01/27/21 97.7 F (36.5 C) (Skin)  08/11/15 (!) 96.2 F (35.7 C)   BP Readings from Last 3 Encounters:  01/27/21 107/65  08/11/15 (!) 103/59   Pulse Readings from Last 3 Encounters:  01/27/21 (!) 56  08/11/15 (!) 55    /10  In general this is a well appearing Caucasian male in no acute distress. He's alert and oriented x4 and appropriate throughout the examination. Cardiopulmonary assessment is negative for acute distress, and he exhibits normal effort.     KPS = 100  100 - Normal; no complaints;  no evidence of disease. 90   - Able to carry on normal activity; minor signs or symptoms of disease. 80   - Normal activity with effort; some signs or symptoms of disease. 31   - Cares for self; unable to carry on normal activity or to do active work. 60   - Requires occasional assistance, but is able to care for most of his personal needs. 50   - Requires considerable assistance and frequent medical care. 40   -  Disabled; requires special care and assistance. 30   - Severely disabled; hospital admission is indicated although death not imminent. 20   - Very sick; hospital admission necessary; active supportive treatment necessary. 10   - Moribund; fatal processes progressing rapidly. 0     - Dead  Karnofsky DA, Abelmann WH, Craver LS and Burchenal JH [phone]) The use of the nitrogen mustards in the palliative treatment of carcinoma: with particular reference to bronchogenic carcinoma Cancer 1 634-56  LABORATORY DATA:  No results found for: "WBC", "HGB", "HCT", "MCV", "PLT" No results found for: "NA", "K", "CL", "CO2" No results found for: "ALT", "AST", "GGT", "ALKPHOS", "BILITOT"   RADIOGRAPHY: No results found.    IMPRESSION/PLAN: 1. 71 y.o. gentleman with Stage T1c adenocarcinoma of the prostate with Gleason Score of 4+3, and PSA of 6.60. We discussed the patient's workup and outlined the nature of prostate cancer in this setting. The patient's T stage, Gleason's score, and PSA put him into the unfavorable intermediate risk group. Accordingly, he is eligible for a variety of potential treatment options including prostatectomy, brachytherapy or 5.5 weeks of external radiation +/- ST-ADT. We discussed the available radiation techniques, and focused on the details and logistics of delivery. We discussed and outlined the risks, benefits, short and long-term effects associated with radiotherapy and compared and contrasted these with prostatectomy. We discussed the role of SpaceOAR gel in reducing  the rectal toxicity associated with radiotherapy. We also detailed the role of ADT in the treatment of unfavorable intermediate risk prostate cancer and outlined the associated side effects that could be expected with this therapy.  He appears to have a good understanding of his disease and our treatment recommendations which are of curative intent.  He was encouraged to ask questions that were answered to his stated satisfaction.  At the conclusion of our conversation, the patient is interested in moving forward with brachytherapy and use of SpaceOAR gel to reduce rectal toxicity from radiotherapy.  We will share our discussion with Dr. Annabell Howells and move forward with scheduling his [facility] planning appointment in the near future.  The patient met briefly with Darryl Nestle in our office who will be working closely with him to coordinate OR scheduling and pre and post procedure appointments.  We will contact the pharmaceutical rep to ensure that SpaceOAR is available at the time of procedure.  We enjoyed meeting him today and look forward to continuing to participate in his care.  We personally spent 70 minutes in this encounter including chart review, reviewing radiological studies, meeting face-to-face with the patient, entering orders and completing documentation.    Marguarite Arbour, PA-C    Margaretmary Dys, MD  [facility]  Radiation Oncology Direct Dial: [phone]  Fax: [phone] [email or web address]  Skype  LinkedIn

## 2023-08-23 NOTE — Progress Notes (Signed)
Introduced myself to the patient as the prostate nurse navigator.  No barriers to care identified at this time.  He is here to discuss his radiation treatment options and will proceed with brachytherapy.  I gave him my business card and asked him to call me with questions or concerns.  Verbalized understanding.

## 2023-08-26 DIAGNOSIS — C61 Malignant neoplasm of prostate: Secondary | ICD-10-CM | POA: Diagnosis not present

## 2023-08-26 DIAGNOSIS — Z191 Hormone sensitive malignancy status: Secondary | ICD-10-CM | POA: Diagnosis not present

## 2023-08-27 ENCOUNTER — Other Ambulatory Visit: Payer: Self-pay | Admitting: Urology

## 2023-08-27 ENCOUNTER — Telehealth: Payer: Self-pay | Admitting: *Deleted

## 2023-08-27 NOTE — Telephone Encounter (Signed)
CALLED PATIENT TO UPDATE, LVM FOR A RETURN CALL 

## 2023-08-28 ENCOUNTER — Telehealth: Payer: Self-pay | Admitting: *Deleted

## 2023-08-28 NOTE — Telephone Encounter (Signed)
CALLED PATIENT TO INFORM OF PRE-SEED APPTS. AND IMPLANT DATE, SPOKE WITH PATIENT AND HE IS AWARE OF THESE APPTS. 

## 2023-09-26 ENCOUNTER — Telehealth: Payer: Self-pay | Admitting: *Deleted

## 2023-09-26 NOTE — Telephone Encounter (Signed)
CALLED PATIENT TO REMIND OF PRE-SEED APPTS. FOR 09-27-23, SPOKE WITH PATIENT AND HE IS AWARE OF THESE APPTS.

## 2023-09-26 NOTE — Progress Notes (Signed)
Radiation Oncology         [phone] ________________________________  Outpatient Follow up- Pre-seed visit  Name: Eddie Nunez MRN: [id number]  Date: 09/27/2023  DOB: 02-Jan-1951  [id number], Gerlene Burdock, MD  Bjorn Pippin, MD   REFERRING PHYSICIAN: Bjorn Pippin, MD  DIAGNOSIS: 72 y.o. gentleman with Stage T1c adenocarcinoma of the prostate with Gleason score of 4+3, and PSA of 6.60.     ICD-10-CM   1. Malignant neoplasm of prostate (HCC)  C61       HISTORY OF PRESENT ILLNESS: Eddie Nunez is a 72 y.o. male with a diagnosis of prostate cancer. He has a history of elevated PSA since at least 2021 and this has been fluctuating.  His PSA was 4.5 in September 2022 on routine labs with his primary care physician, Dr. Jacky Kindle.  Accordingly, he was referred for evaluation in urology by Dr. Annabell Howells on 08/21/2021,  digital rectal examination performed at that time showed no discrete nodules or concerning findings.  He had a prostate MRI on 10/14/2021 that was without any evidence of high-grade lesions.  Continued monitoring of his PSA showed a persistent rise at 4.05 in March 2023, 4.35 in September 2023 and 5.53 in March 2024.  At that time, it was recommended to proceed with prostate biopsy but he elected to continue to monitor the PSA since he was scheduled for a donor nephrectomy for a fellow veteran.  He had a repeat PSA on 03/20/2023 that was further elevated at 6.92.  Therefore, the patient proceeded to transrectal ultrasound with 12 biopsies of the prostate on 06/12/2023.  A repeat PSA that day was stable at 6.60. The prostate volume measured 35.5 cc.  Out of 12 core biopsies, 9 were positive.  The maximum Gleason score was 4+3, and this was seen in the right mid lateral.  Additionally, Gleason 3+4 was seen in the left mid lateral and left apex lateral and Gleason 3+3 in the left base lateral, left mid, left apex, right mid, right apex and right apex lateral.  He did have a repeat PSA on 06/19/2023 that  was 13.50 but not felt to be reliable since he had just recently had prostate biopsy.   A PSMA PET scan was performed on 07/01/2023 to complete his disease staging and was without any evidence of metastatic disease.  The patient reviewed the biopsy results with his urologist and was kindly referred to [place] for discussion of potential radiation treatment options. We initially met the patient on 08/23/23 and he was most interested in proceeding with brachytherapy and SpaceOAR gel placement for treatment of his disease. He is here today for his pre-procedure imaging for planning and to answer any additional questions he may have about this treatment.   PREVIOUS RADIATION THERAPY: No  PAST MEDICAL HISTORY:  Past Medical History:  Diagnosis Date   Elevated PSA    History of benign bladder tumor    Hypercholesterolemia    Kidney disease       PAST SURGICAL HISTORY: Past Surgical History:  Procedure Laterality Date   BLADDER TUMOR EXCISION  1993   COLONOSCOPY  2016   MS-MAC-suprep(Exc)-TA x2   POLYPECTOMY  2016   TA x 2   PROSTATE BIOPSY     TONSILLECTOMY     WISDOM TOOTH EXTRACTION      FAMILY HISTORY:  Family History  Problem Relation Age of Onset   Colon cancer Neg Hx    Esophageal cancer Neg Hx    Rectal cancer Neg  Hx    Stomach cancer Neg Hx    Colon polyps Neg Hx     SOCIAL HISTORY:  Social History   Socioeconomic History   Marital status: Married    Spouse name: Not on file   Number of children: Not on file   Years of education: Not on file   Highest education level: Not on file  Occupational History   Not on file  Tobacco Use   Smoking status: Never   Smokeless tobacco: Never  Vaping Use   Vaping status: Never Used  Substance and Sexual Activity   Alcohol use: Yes    Alcohol/week: 21.0 standard drinks of alcohol    Types: 21 Standard drinks or equivalent per week    Comment: 3 per day   Drug use: No   Sexual activity: Not on file  Other Topics Concern    Not on file  Social History Narrative   Not on file   Social Drivers of Health   Financial Resource Strain: Not on file  Food Insecurity: No Food Insecurity (08/23/2023)   Hunger Vital Sign    Worried About Running Out of Food in the Last Year: Never true    Ran Out of Food in the Last Year: Never true  Transportation Needs: No Transportation Needs (08/23/2023)   PRAPARE - Administrator, Civil Service (Medical): No    Lack of Transportation (Non-Medical): No  Physical Activity: Not on file  Stress: Not on file  Social Connections: Not on file  Intimate Partner Violence: Not At Risk (08/23/2023)   Humiliation, Afraid, Rape, and Kick questionnaire    Fear of Current or Ex-Partner: No    Emotionally Abused: No    Physically Abused: No    Sexually Abused: No    ALLERGIES: Patient has no known allergies.  MEDICATIONS:  Current Outpatient Medications  Medication Sig Dispense Refill   rosuvastatin (CRESTOR) 10 MG tablet TAKE ONE TABLET EACH DAY for 30     sildenafil (VIAGRA) 100 MG tablet Take 0.5 tablets (50 mg total) by mouth daily as needed for up to 10 days for erectile dysfunction. 10 tablet 2   No current facility-administered medications for this encounter.    REVIEW OF SYSTEMS:  On review of systems, the patient reports that he is doing well overall. He denies any chest pain, shortness of breath, cough, fevers, chills, night sweats, unintended weight changes. He denies any bowel disturbances, and denies abdominal pain, nausea or vomiting. He denies any new musculoskeletal or joint aches or pains. His IPSS was 7, indicating mild urinary symptoms. His SHIM was 17, indicating he has mild erectile dysfunction. A complete review of systems is obtained and is otherwise negative.     PHYSICAL EXAM:  Wt Readings from Last 3 Encounters:  09/27/23 202 lb (91.6 kg)  08/23/23 202 lb 2 oz (91.7 kg)  01/27/21 197 lb (89.4 kg)   Temp Readings from Last 3 Encounters:   08/23/23 (!) 97.1 F (36.2 C) (Temporal)  01/27/21 97.7 F (36.5 C) (Skin)  08/11/15 (!) 96.2 F (35.7 C)   BP Readings from Last 3 Encounters:  08/23/23 (!) 142/87  01/27/21 107/65  08/11/15 (!) 103/59   Pulse Readings from Last 3 Encounters:  08/23/23 (!) 59  01/27/21 (!) 56  08/11/15 (!) 55   Pain Assessment Pain Score: 0-No pain/10  In general this is a well appearing Caucasian male in no acute distress. He's alert and oriented x4 and appropriate throughout the  examination. Cardiopulmonary assessment is negative for acute distress, and he exhibits normal effort.     KPS = 100  100 - Normal; no complaints; no evidence of disease. 90   - Able to carry on normal activity; minor signs or symptoms of disease. 80   - Normal activity with effort; some signs or symptoms of disease. 57   - Cares for self; unable to carry on normal activity or to do active work. 60   - Requires occasional assistance, but is able to care for most of his personal needs. 50   - Requires considerable assistance and frequent medical care. 40   - Disabled; requires special care and assistance. 30   - Severely disabled; hospital admission is indicated although death not imminent. 20   - Very sick; hospital admission necessary; active supportive treatment necessary. 10   - Moribund; fatal processes progressing rapidly. 0     - Dead  Karnofsky DA, Abelmann WH, Craver LS and Burchenal JH [phone]) The use of the nitrogen mustards in the palliative treatment of carcinoma: with particular reference to bronchogenic carcinoma Cancer 1 634-56  LABORATORY DATA:  No results found for: "WBC", "HGB", "HCT", "MCV", "PLT" No results found for: "NA", "K", "CL", "CO2" No results found for: "ALT", "AST", "GGT", "ALKPHOS", "BILITOT"   RADIOGRAPHY: No results found.    IMPRESSION/PLAN: 1. 71 y.o. gentleman with Stage T1c adenocarcinoma of the prostate with Gleason score of 4+3, and PSA of 6.60. The patient has elected  to proceed with seed implant for treatment of his disease. We reviewed the risks, benefits, short and long-term effects associated with brachytherapy and discussed the role of SpaceOAR in reducing the rectal toxicity associated with radiotherapy.  He appears to have a good understanding of his disease and our treatment recommendations which are of curative intent.  He was encouraged to ask questions that were answered to his stated satisfaction. He has freely signed written consent to proceed today in the office and a copy of this document will be placed in his medical record. His procedure is tentatively scheduled for 11/05/23 in collaboration with Dr. Annabell Howells and we will see him back for his post-procedure visit approximately 3 weeks thereafter. We look forward to continuing to participate in his care. He knows that he is welcome to call with any questions or concerns at any time in the interim.  I personally spent 30 minutes in this encounter including chart review, reviewing radiological studies, meeting face-to-face with the patient, entering orders and completing documentation.    Marguarite Arbour, MMS, PA-C [facility] Radiation Oncology Physician Assistant Direct Dial: [phone]  Fax: [phone]

## 2023-09-26 NOTE — Progress Notes (Signed)
  Radiation Oncology         [phone] ________________________________  Name: Eddie Nunez MRN: [id number]  Date: 09/27/2023  DOB: [birth year]  SIMULATION AND TREATMENT PLANNING NOTE PUBIC ARCH STUDY  [id number], Gerlene Burdock, MD  Bjorn Pippin, MD  DIAGNOSIS:  72 y.o. gentleman with Stage T1c adenocarcinoma of the prostate with Gleason score of 4+3, and PSA of 6.60.   Oncology History  Malignant neoplasm of prostate (HCC)  06/12/2023 Cancer Staging   Staging form: Prostate, AJCC 8th Edition - Clinical stage from 06/12/2023: Stage IIC (cT1c, cN0, cM0, PSA: 6.9, Grade Group: 3) - Signed by Marcello Fennel, PA-C on 08/22/2023 Histopathologic type: Adenocarcinoma, NOS Stage prefix: Initial diagnosis Prostate specific antigen (PSA) range: Less than 10 Gleason primary pattern: 4 Gleason secondary pattern: 3 Gleason score: 7 Histologic grading system: 5 grade system Number of biopsy cores examined: 12 Number of biopsy cores positive: 9 Location of positive needle core biopsies: Both sides   08/22/2023 Initial Diagnosis   Malignant neoplasm of prostate (HCC)       ICD-10-CM   1. Malignant neoplasm of prostate (HCC)  C61       COMPLEX SIMULATION:  The patient presented today for evaluation for possible prostate seed implant. He was brought to the radiation planning suite and placed supine on the CT couch. A 3-dimensional image study set was obtained in upload to the planning computer. There, on each axial slice, I contoured the prostate gland. Then, using three-dimensional radiation planning tools I reconstructed the prostate in view of the structures from the transperineal needle pathway to assess for possible pubic arch interference. In doing so, I did not appreciate any pubic arch interference. Also, the patient's prostate volume was estimated based on the drawn structure. The volume was 36 cc.  Given the pubic arch appearance and prostate volume, patient remains a good candidate to  proceed with prostate seed implant. Today, he freely provided informed written consent to proceed.    PLAN: The patient will undergo prostate seed implant.   ________________________________  Artist Pais. Kathrynn Running, M.D.

## 2023-09-27 ENCOUNTER — Ambulatory Visit
Admission: RE | Admit: 2023-09-27 | Discharge: 2023-09-27 | Disposition: A | Discharge status: Home / Self Care | Payer: Medicare Other | Source: Ambulatory Visit | Attending: Radiation Oncology | Admitting: Radiation Oncology

## 2023-09-27 ENCOUNTER — Ambulatory Visit
Admission: RE | Admit: 2023-09-27 | Discharge: 2023-09-27 | Disposition: A | Discharge status: Home / Self Care | Payer: Medicare Other | Source: Ambulatory Visit | Attending: Urology | Admitting: Urology

## 2023-09-27 ENCOUNTER — Encounter: Payer: Self-pay | Admitting: Urology

## 2023-09-27 VITALS — Ht 70.0 in | Wt 202.0 lb

## 2023-09-27 DIAGNOSIS — C61 Malignant neoplasm of prostate: Secondary | ICD-10-CM | POA: Diagnosis not present

## 2023-09-27 DIAGNOSIS — Z191 Hormone sensitive malignancy status: Secondary | ICD-10-CM | POA: Diagnosis not present

## 2023-09-27 NOTE — Progress Notes (Signed)
Pre-seed nursing interview for a diagnosis of Stage T1c adenocarcinoma of the prostate with Gleason score of 4+3, and PSA of 6.60.  Patient identity verified x2.   Patient reports doing well. No issues conveyed at this time.  Meaningful use complete.  Urinary Management medication(s)- None Urology appointment date- 10/22/2023, with Dr. Annabell Howells at [facility]  Ht 5\' 10"  (1.778 m)   Wt 202 lb (91.6 kg)   BMI 28.98 kg/m   This concludes the interaction.  Ruel Favors, LPN

## 2023-10-10 ENCOUNTER — Ambulatory Visit: Payer: TRICARE For Life (TFL) | Admitting: Radiation Oncology

## 2023-10-10 ENCOUNTER — Ambulatory Visit: Payer: Self-pay | Admitting: Urology

## 2023-10-22 DIAGNOSIS — C61 Malignant neoplasm of prostate: Secondary | ICD-10-CM | POA: Diagnosis not present

## 2023-11-04 ENCOUNTER — Telehealth: Payer: Self-pay | Admitting: *Deleted

## 2023-11-04 ENCOUNTER — Encounter (HOSPITAL_BASED_OUTPATIENT_CLINIC_OR_DEPARTMENT_OTHER): Payer: Self-pay | Admitting: Urology

## 2023-11-04 NOTE — Telephone Encounter (Signed)
CALLED PATIENT TO REMIND OF PROCEDURE FOR 11-05-23, SPOKE WITH PATIENT AND HE IS AWARE OF THIS PROCEDURE

## 2023-11-04 NOTE — H&P (Signed)
Pt presents today for pre-operative history and physical exam in anticipation of brachytherapy and space oar by Dr. Annabell Howells on 11/05/23. He is doing well and is without complaint.   Pt denies F/C, HA, CP, SOB, N/V, diarrhea/constipation, back pain, flank pain, hematuria, and dysuria.   HX:     CC/HPI: I have prostate cancer.     08/14/23: Eddie Nunez returns today to discuss his prostate biopsy and staging. He was found to have high volume prostate cancer with 1 core of GG3, 2 cores of GG2 and 6 cores of GG1. 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 His prostate volume is 35ml. His PSA was 6.6 prior to the biopsy but he had a PSA done after the biopsy for some reason that was 13.5 from the biopsy impact. He had a borderline ExoDx of 19 in 2022 and an MRI with only PIRADS 2 findings. His IPSS is 8 with nocturia x 1. 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 His SHIM is 20 which is consistent with mild ED.     ALLERGIES: None   MEDICATIONS: Levofloxacin 750 mg tablet 1 po 1 hour prior to the procedure  Rosuvastatin Calcium 1 tablet PO Daily     GU PSH: Prostate Needle Biopsy - 06/12/2023       PSH Notes: Removal of bladder tumor- 1994   Left nephrectomy for organ donation    NON-GU PSH: Surgical Pathology, Gross And Microscopic Examination For Prostate Needle - 06/12/2023 Visit Complexity (formerly GPC1X) - 08/14/2023, 12/17/2022     GU PMH: Elevated PSA, His PSA jumped to 13.5 but that was done a week after the biopsy for some reason. This is artifact. - 08/14/2023, - 06/12/2023, His PSA continues to rise and the f/t ratio is of intermediate risk. With the further increase I think he needs a biopsy despite the MRIP and borderline ExoDx. I reviewed the risks of bleeding, infection and voiding difficulty. Levaquin sent. , - 12/17/2022, His PSA remains stable with a borderline f/t ratio of 22%. His exam is benign and the  other studies point to a relatively low risk of high grade prostate cancer. I will just have him return in 6 months with another PSA. , - 06/15/2022, His PSA is down to 4.05. 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 I think at this time we can just follow his PSA's. I will have him return in 6 months with a PSA for an exam. , - 12/18/2021, He has a rising PSA with only a slight increase over the last 18 months and his exam is benign. I discussed options including proceeding with a standard biopsy, doing an MRI of the prostate to assess the need for a biopsy and aid targeting or consideration of a urinary genomic test to better assess risk prior to considering a biopsy. He would like to go with the urine test so I will order an ExoDx test for him. , - 08/21/2021 Prostate Cancer, He has unfavorable intermediate risk T1c N0 M0 prostate cancer with primarily GG1 and GG2 disease but a single core of GG3 with 20% involvement. He has a 35ml prostate with mild LUTS and mild ED. He has minimal comorbidities. I discussed Active surveillance, Cryo, HIFU, RALP, EXRT and Seeds with spaceOAR and reviewed the risks and benefits of each. He is most interested in Brachytherapy so I will get him  set up for a consultation with Dr. Kathrynn Running. - 08/14/2023 BPH w/o LUTS - 06/15/2022, He has minimal LUTS., - 12/18/2021, He has BPH with mild enlargement and no LUTS. , - 08/21/2021 History of bladder cancer      PMH Notes:  1898-10-08 00:00:00 - Note: Normal Routine History And Physical Adult    HLD   NON-GU PMH: None   FAMILY HISTORY: 2 daughters - Other 2 sons - Other Death In The Family Father - Other Death In The Family Mother - Other   SOCIAL HISTORY: Marital Status: Married Preferred Language: English; Ethnicity: Not Hispanic Or Latino; Race: White Current Smoking Status: Patient has never smoked.   Tobacco Use Assessment Completed: Used Tobacco in last 30 days? Does not use smokeless  tobacco. Drinks 2 drinks per day.  Does not use drugs. Drinks 2 caffeinated drinks per day. Has not had a blood transfusion.     Notes: ETOH 3 gin drinks every day    REVIEW OF SYSTEMS:    GU Review Male:   Patient denies frequent urination, hard to postpone urination, burning/ pain with urination, get up at night to urinate, leakage of urine, stream starts and stops, trouble starting your stream, have to strain to urinate , erection problems, and penile pain.  Gastrointestinal (Upper):   Patient denies nausea, vomiting, and indigestion/ heartburn.  Gastrointestinal (Lower):   Patient denies diarrhea and constipation.  Constitutional:   Patient denies fever, night sweats, weight loss, and fatigue.  Skin:   Patient denies skin rash/ lesion and itching.  Eyes:   Patient denies blurred vision and double vision.  Ears/ Nose/ Throat:   Patient denies sore throat and sinus problems.  Hematologic/Lymphatic:   Patient denies swollen glands and easy bruising.  Cardiovascular:   Patient denies leg swelling and chest pains.  Respiratory:   Patient denies cough and shortness of breath.  Endocrine:   Patient denies excessive thirst.  Musculoskeletal:   Patient denies back pain and joint pain.  Neurological:   Patient denies headaches and dizziness.  Psychologic:   Patient denies depression and anxiety.   VITAL SIGNS:      10/22/2023 01:56 PM  BP 124/80 mmHg  Pulse 61 /min  Temperature 97.7 F / 36.5 C   MULTI-SYSTEM PHYSICAL EXAMINATION:    Constitutional: Well-nourished. No physical deformities. Normally developed. Good grooming.  Neck: Neck symmetrical, not swollen. Normal tracheal position.  Respiratory: Normal breath sounds. No labored breathing, no use of accessory muscles.   Cardiovascular: Regular rate and rhythm. No murmur, no gallop.   Lymphatic: No enlargement of neck, axillae, groin.  Skin: No paleness, no jaundice, no cyanosis. No lesion, no ulcer, no rash.  Neurologic /  Psychiatric: Oriented to time, oriented to place, oriented to person. No depression, no anxiety, no agitation.  Gastrointestinal: No mass, no tenderness, no rigidity, non obese abdomen.  Eyes: Normal conjunctivae. Normal eyelids.  Ears, Nose, Mouth, and Throat: Left ear no scars, no lesions, no masses. Right ear no scars, no lesions, no masses. Nose no scars, no lesions, no masses. Normal hearing. Normal lips.  Musculoskeletal: Normal gait and station of head and neck.     Complexity of Data:  Records Review:   Previous Patient Records  Urine Test Review:   Urinalysis   10/22/23  Urinalysis  Urine Appearance Clear   Urine Color Yellow   Urine Glucose Neg mg/dL  Urine Bilirubin Neg mg/dL  Urine Ketones Neg mg/dL  Urine Specific Gravity 1.010   Urine Blood  Neg ery/uL  Urine pH <=5.0   Urine Protein Trace mg/dL  Urine Urobilinogen 0.2 mg/dL  Urine Nitrites Neg   Urine Leukocyte Esterase Neg leu/uL   PROCEDURES:          Urinalysis - 81003 Dipstick Dipstick Cont'd  Color: Yellow Bilirubin: Neg mg/dL  Appearance: Clear Ketones: Neg mg/dL  Specific Gravity: 4.540 Blood: Neg ery/uL  pH: <=5.0 Protein: Trace mg/dL  Glucose: Neg mg/dL Urobilinogen: 0.2 mg/dL    Nitrites: Neg    Leukocyte Esterase: Neg leu/uL    ASSESSMENT:      ICD-10 Details  1 GU:   Prostate Cancer - C61    PLAN:           Schedule Return Visit/Planned Activity: Keep Scheduled Appointment - Schedule Surgery          Document Letter(s):  Created for Patient: Clinical Summary         Notes:   There are no changes in the patients history or physical exam since last evaluation by Dr. Annabell Howells. Pt is scheduled to undergo seeds and space oar on 11/05/23.   All pt's questions were answered to the best of my ability.          Next Appointment:      Next Appointment: 11/05/2023 10:00 AM    Appointment Type: Surgery     Location: [facility] [phone]    Provider: Bjorn Pippin, M.D.     Reason for Visit: OP NE SEEDS SPACE OAR

## 2023-11-04 NOTE — Progress Notes (Signed)
Spoke w/ via phone for pre-op interview--- Eddie Nunez needs dos----  NONE       Nunez results------ COVID test -----patient states asymptomatic no test needed Arrive at -------0800 NPO after MN NO Solid Food.  Med rec completed Medications to take morning of surgery -----NONE Diabetic medication ----- Patient instructed no nail polish to be worn day of surgery Patient instructed to bring photo id and insurance card day of surgery Patient aware to have Driver (ride ) / caregiver    for 24 hours after surgery - [facility] Patient Special Instructions -----FLEETS enema preop Pre-Op special Instructions ----- Patient verbalized understanding of instructions that were given at this phone interview. Patient denies chest pain, sob, fever, cough at the interview.

## 2023-11-05 ENCOUNTER — Ambulatory Visit (HOSPITAL_BASED_OUTPATIENT_CLINIC_OR_DEPARTMENT_OTHER): Payer: Medicare Other | Admitting: Anesthesiology

## 2023-11-05 ENCOUNTER — Encounter (HOSPITAL_BASED_OUTPATIENT_CLINIC_OR_DEPARTMENT_OTHER): Payer: Self-pay | Admitting: Urology

## 2023-11-05 ENCOUNTER — Ambulatory Visit (HOSPITAL_BASED_OUTPATIENT_CLINIC_OR_DEPARTMENT_OTHER)
Admission: RE | Admit: 2023-11-05 | Discharge: 2023-11-05 | Disposition: A | Discharge status: Home / Self Care | Payer: Medicare Other | Attending: Urology | Admitting: Urology

## 2023-11-05 ENCOUNTER — Ambulatory Visit (HOSPITAL_COMMUNITY): Payer: Medicare Other

## 2023-11-05 ENCOUNTER — Encounter (HOSPITAL_BASED_OUTPATIENT_CLINIC_OR_DEPARTMENT_OTHER)
Admission: RE | Disposition: A | Discharge status: Home / Self Care | Payer: Self-pay | Source: Home / Self Care | Attending: Urology

## 2023-11-05 DIAGNOSIS — Z905 Acquired absence of kidney: Secondary | ICD-10-CM | POA: Insufficient documentation

## 2023-11-05 DIAGNOSIS — E7800 Pure hypercholesterolemia, unspecified: Secondary | ICD-10-CM | POA: Diagnosis not present

## 2023-11-05 DIAGNOSIS — C61 Malignant neoplasm of prostate: Secondary | ICD-10-CM | POA: Diagnosis not present

## 2023-11-05 DIAGNOSIS — Z191 Hormone sensitive malignancy status: Secondary | ICD-10-CM | POA: Diagnosis not present

## 2023-11-05 HISTORY — PX: RADIOACTIVE SEED IMPLANT: SHX5150

## 2023-11-05 HISTORY — PX: SPACE OAR INSTILLATION: SHX6769

## 2023-11-05 HISTORY — DX: Malignant (primary) neoplasm, unspecified: C80.1

## 2023-11-05 SURGERY — INSERTION, RADIATION SOURCE, PROSTATE
Anesthesia: General | Site: Prostate

## 2023-11-05 MED ORDER — MIDAZOLAM HCL 2 MG/2ML IJ SOLN
INTRAMUSCULAR | Status: AC
  Filled 2023-11-05: qty 2

## 2023-11-05 MED ORDER — FLEET ENEMA RE ENEM
1.0000 | ENEMA | Freq: Once | RECTAL | Status: DC
Start: 2023-11-06 — End: 2023-11-05

## 2023-11-05 MED ORDER — FENTANYL CITRATE (PF) 100 MCG/2ML IJ SOLN
INTRAMUSCULAR | Status: AC
  Filled 2023-11-05: qty 2

## 2023-11-05 MED ORDER — MIDAZOLAM HCL 5 MG/5ML IJ SOLN
INTRAMUSCULAR | Status: DC | PRN
  Administered 2023-11-05 (×2): 2 mg via INTRAVENOUS

## 2023-11-05 MED ORDER — EPHEDRINE SULFATE-NACL 50-0.9 MG/10ML-% IV SOSY
PREFILLED_SYRINGE | INTRAVENOUS | Status: DC | PRN
  Administered 2023-11-05 (×4): 5 mg via INTRAVENOUS
  Administered 2023-11-05 (×2): 10 mg via INTRAVENOUS

## 2023-11-05 MED ORDER — EPHEDRINE 5 MG/ML INJ
INTRAVENOUS | Status: AC
  Filled 2023-11-05: qty 5

## 2023-11-05 MED ORDER — PROPOFOL 10 MG/ML IV BOLUS
INTRAVENOUS | Status: DC | PRN
  Administered 2023-11-05: 150 mg via INTRAVENOUS
  Administered 2023-11-05: 10 mg via INTRAVENOUS

## 2023-11-05 MED ORDER — ONDANSETRON HCL 4 MG/2ML IJ SOLN
INTRAMUSCULAR | Status: DC | PRN
  Administered 2023-11-05: 4 mg via INTRAVENOUS

## 2023-11-05 MED ORDER — IOHEXOL 300 MG/ML  SOLN
INTRAMUSCULAR | Status: DC | PRN
  Administered 2023-11-05: 7 mL

## 2023-11-05 MED ORDER — SODIUM CHLORIDE 0.9% FLUSH: 3.0000 mL | Freq: Two times a day (BID) | INTRAVENOUS | Status: DC

## 2023-11-05 MED ORDER — ROCURONIUM BROMIDE 10 MG/ML (PF) SYRINGE
PREFILLED_SYRINGE | INTRAVENOUS | Status: AC
  Filled 2023-11-05: qty 10

## 2023-11-05 MED ORDER — SODIUM CHLORIDE 0.9 % IV SOLN: INTRAVENOUS | Status: DC

## 2023-11-05 MED ORDER — SODIUM CHLORIDE 0.9 % IV SOLN: 12.5000 mg | INTRAVENOUS | Status: DC | PRN

## 2023-11-05 MED ORDER — OXYCODONE HCL 5 MG/5ML PO SOLN: 5.0000 mg | Freq: Once | ORAL | Status: DC | PRN

## 2023-11-05 MED ORDER — AMISULPRIDE (ANTIEMETIC) 5 MG/2ML IV SOLN
10.0000 mg | Freq: Once | INTRAVENOUS | Status: DC | PRN
Start: 2023-11-05 — End: 2023-11-05

## 2023-11-05 MED ORDER — ONDANSETRON HCL 4 MG/2ML IJ SOLN
INTRAMUSCULAR | Status: AC
  Filled 2023-11-05: qty 2

## 2023-11-05 MED ORDER — HYDROCODONE-ACETAMINOPHEN 5-325 MG PO TABS: 1.0000 | ORAL_TABLET | Freq: Four times a day (QID) | ORAL | 0 refills | Status: DC | PRN

## 2023-11-05 MED ORDER — CIPROFLOXACIN IN D5W 400 MG/200ML IV SOLN
400.0000 mg | INTRAVENOUS | Status: AC
  Administered 2023-11-05: 400 mg via INTRAVENOUS

## 2023-11-05 MED ORDER — LIDOCAINE HCL (PF) 2 % IJ SOLN
INTRAMUSCULAR | Status: AC
  Filled 2023-11-05: qty 5

## 2023-11-05 MED ORDER — SODIUM CHLORIDE (PF) 0.9 % IJ SOLN
INTRAMUSCULAR | Status: DC | PRN
  Administered 2023-11-05: 10 mL

## 2023-11-05 MED ORDER — HYDROMORPHONE HCL 1 MG/ML IJ SOLN: 0.2500 mg | INTRAMUSCULAR | Status: DC | PRN

## 2023-11-05 MED ORDER — SODIUM CHLORIDE 0.9 % IR SOLN
Status: DC | PRN
  Administered 2023-11-05: 1000 mL via INTRAVESICAL

## 2023-11-05 MED ORDER — STERILE WATER FOR IRRIGATION IR SOLN
Status: DC | PRN
  Administered 2023-11-05: 3 mL

## 2023-11-05 MED ORDER — SUGAMMADEX SODIUM 200 MG/2ML IV SOLN
INTRAVENOUS | Status: DC | PRN
  Administered 2023-11-05: 200 mg via INTRAVENOUS

## 2023-11-05 MED ORDER — CIPROFLOXACIN IN D5W 400 MG/200ML IV SOLN
INTRAVENOUS | Status: AC
  Filled 2023-11-05: qty 200

## 2023-11-05 MED ORDER — DEXAMETHASONE SODIUM PHOSPHATE 10 MG/ML IJ SOLN
INTRAMUSCULAR | Status: AC
  Filled 2023-11-05: qty 1

## 2023-11-05 MED ORDER — PROPOFOL 10 MG/ML IV BOLUS
INTRAVENOUS | Status: AC
  Filled 2023-11-05: qty 20

## 2023-11-05 MED ORDER — FENTANYL CITRATE (PF) 100 MCG/2ML IJ SOLN
INTRAMUSCULAR | Status: DC | PRN
  Administered 2023-11-05 (×2): 25 ug via INTRAVENOUS
  Administered 2023-11-05: 100 ug via INTRAVENOUS

## 2023-11-05 MED ORDER — OXYCODONE HCL 5 MG PO TABS
5.0000 mg | ORAL_TABLET | Freq: Once | ORAL | Status: DC | PRN
Start: 2023-11-05 — End: 2023-11-05

## 2023-11-05 MED ORDER — DEXAMETHASONE SODIUM PHOSPHATE 10 MG/ML IJ SOLN
INTRAMUSCULAR | Status: DC | PRN
  Administered 2023-11-05: 10 mg via INTRAVENOUS

## 2023-11-05 MED ORDER — ROCURONIUM BROMIDE 10 MG/ML (PF) SYRINGE
PREFILLED_SYRINGE | INTRAVENOUS | Status: DC | PRN
  Administered 2023-11-05: 10 mg via INTRAVENOUS
  Administered 2023-11-05: 50 mg via INTRAVENOUS
  Administered 2023-11-05: 10 mg via INTRAVENOUS

## 2023-11-05 MED ORDER — LACTATED RINGERS IV SOLN: INTRAVENOUS | Status: DC

## 2023-11-05 MED ORDER — LIDOCAINE 2% (20 MG/ML) 5 ML SYRINGE
INTRAMUSCULAR | Status: DC | PRN
  Administered 2023-11-05: 80 mg via INTRAVENOUS

## 2023-11-05 SURGICAL SUPPLY — 43 items
BAG URINE DRAIN 2000ML AR STRL (UROLOGICAL SUPPLIES) ×3 IMPLANT
BLADE CLIPPER SENSICLIP SURGIC (BLADE) ×3 IMPLANT
CATH FOLEY 2WAY SLVR 5CC 16FR (CATHETERS) ×3 IMPLANT
CATH ROBINSON RED A/P 16FR (CATHETERS) IMPLANT
CATH ROBINSON RED A/P 20FR (CATHETERS) ×3 IMPLANT
CLOTH BEACON ORANGE TIMEOUT ST (SAFETY) ×3 IMPLANT
CNTNR URN SCR LID CUP LEK RST (MISCELLANEOUS) ×3 IMPLANT
COVER BACK TABLE 60X90IN (DRAPES) ×3 IMPLANT
COVER MAYO STAND STRL (DRAPES) ×3 IMPLANT
DRSG TEGADERM 4X4.75 (GAUZE/BANDAGES/DRESSINGS) ×3 IMPLANT
DRSG TEGADERM 8X12 (GAUZE/BANDAGES/DRESSINGS) ×3 IMPLANT
GAUZE SPONGE 4X4 12PLY STRL (GAUZE/BANDAGES/DRESSINGS) IMPLANT
GLOVE BIO SURGEON STRL SZ 6.5 (GLOVE) IMPLANT
GLOVE BIO SURGEON STRL SZ7.5 (GLOVE) IMPLANT
GLOVE BIO SURGEON STRL SZ8 (GLOVE) IMPLANT
GLOVE BIOGEL PI IND STRL 6.5 (GLOVE) ×3 IMPLANT
GLOVE SURG ORTHO 8.5 STRL (GLOVE) ×3 IMPLANT
GLOVE SURG SS PI 8.0 STRL IVOR (GLOVE) ×3 IMPLANT
GOWN STRL REUS W/TWL LRG LVL3 (GOWN DISPOSABLE) ×3 IMPLANT
GRID BRACH TEMP 18GA 2.8X3X.75 (MISCELLANEOUS) ×3 IMPLANT
HOLDER FOLEY CATH W/STRAP (MISCELLANEOUS) ×3 IMPLANT
IMPL SPACEOAR VUE SYSTEM (Spacer) ×3 IMPLANT
IMPLANT SPACEOAR VUE SYSTEM (Spacer) ×2 IMPLANT
IV NS 1000ML BAXH (IV SOLUTION) ×3 IMPLANT
KIT TURNOVER CYSTO (KITS) ×3 IMPLANT
NDL BRACHY 18G 5PK (NEEDLE) ×12 IMPLANT
NDL BRACHY 18G SINGLE (NEEDLE) IMPLANT
NDL PK MORGANSTERN STABILIZ (NEEDLE) ×3 IMPLANT
NEEDLE BRACHY 18G 5PK (NEEDLE) ×8 IMPLANT
NEEDLE BRACHY 18G SINGLE (NEEDLE) IMPLANT
NEEDLE PK MORGANSTERN STABILIZ (NEEDLE) ×2 IMPLANT
PACK CYSTO (CUSTOM PROCEDURE TRAY) ×3 IMPLANT
SHEATH ULTRASOUND LF (SHEATH) IMPLANT
SHEATH ULTRASOUND LTX NONSTRL (SHEATH) IMPLANT
SLEEVE SCD COMPRESS KNEE MED (STOCKING) ×3 IMPLANT
SUT BONE WAX W31G (SUTURE) IMPLANT
SYR 10ML LL (SYRINGE) ×3 IMPLANT
SYR CONTROL 10ML LL (SYRINGE) ×3 IMPLANT
TOWEL OR 17X24 6PK STRL BLUE (TOWEL DISPOSABLE) ×3 IMPLANT
UNDERPAD 30X36 HEAVY ABSORB (UNDERPADS AND DIAPERS) ×6 IMPLANT
WATER STERILE IRR 3000ML UROMA (IV SOLUTION) IMPLANT
WATER STERILE IRR 500ML POUR (IV SOLUTION) ×3 IMPLANT
brachy source i-125 seeds IMPLANT

## 2023-11-05 NOTE — Anesthesia Procedure Notes (Signed)
Procedure Name: Intubation Date/Time: 11/05/2023 10:19 AM  Performed by: Bishop Limbo, CRNAPre-anesthesia Checklist: Patient identified, Emergency Drugs available, Suction available and Patient being monitored Patient Re-evaluated:Patient Re-evaluated prior to induction Oxygen Delivery Method: Circle System Utilized Preoxygenation: Pre-oxygenation with 100% oxygen Induction Type: IV induction Ventilation: Mask ventilation without difficulty Laryngoscope Size: Mac and 4 Grade View: Grade I Tube type: Oral Tube size: 7.5 mm Number of attempts: 1 Airway Equipment and Method: Stylet Placement Confirmation: ETT inserted through vocal cords under direct vision, positive ETCO2 and breath sounds checked- equal and bilateral Secured at: 22 cm Tube secured with: Tape Dental Injury: Teeth and Oropharynx as per pre-operative assessment

## 2023-11-05 NOTE — Transfer of Care (Signed)
Immediate Anesthesia Transfer of Care Note  Patient: Eddie Nunez  Procedure(s) Performed: RADIOACTIVE SEED IMPLANT/BRACHYTHERAPY IMPLANT (Prostate) SPACE OAR INSTILLATION (Perineum)  Patient Location: PACU  Anesthesia Type:General  Level of Consciousness: awake, alert , oriented, and patient cooperative  Airway & Oxygen Therapy: Patient Spontanous Breathing  Post-op Assessment: Report given to RN and Post -op Vital signs reviewed and stable  Post vital signs: Reviewed and stable  Last Vitals:  Vitals Value Taken Time  BP 124/61 11/05/23 1135  Temp    Pulse 66 11/05/23 1137  Resp 17 11/05/23 1137  SpO2 92 % 11/05/23 1137  Vitals shown include unfiled device data.  Last Pain:  Vitals:   11/05/23 0829  TempSrc: Oral  PainSc: 0-No pain      Patients Stated Pain Goal: 5 (11/05/23 0829)  Complications: No notable events documented.

## 2023-11-05 NOTE — Anesthesia Preprocedure Evaluation (Signed)
Anesthesia Evaluation  Patient identified by MRN, date of birth, ID band Patient awake    Reviewed: Allergy & Precautions, H&P , NPO status , Patient's Chart, lab work & pertinent test results  Airway Mallampati: II  TM Distance: >3 FB Neck ROM: Full    Dental no notable dental hx.    Pulmonary neg pulmonary ROS   Pulmonary exam normal breath sounds clear to auscultation       Cardiovascular negative cardio ROS Normal cardiovascular exam Rhythm:Regular Rate:Normal     Neuro/Psych negative neurological ROS  negative psych ROS   GI/Hepatic negative GI ROS, Neg liver ROS,,,  Endo/Other  negative endocrine ROS    Renal/GU negative Renal ROS  negative genitourinary   Musculoskeletal negative musculoskeletal ROS (+)    Abdominal   Peds negative pediatric ROS (+)  Hematology negative hematology ROS (+)   Anesthesia Other Findings Prostate Cancer  Reproductive/Obstetrics negative OB ROS                             Anesthesia Physical Anesthesia Plan  ASA: 3  Anesthesia Plan: General   Post-op Pain Management: Minimal or no pain anticipated   Induction: Intravenous  PONV Risk Score and Plan: 2 and Ondansetron, Midazolam and Treatment may vary due to age or medical condition  Airway Management Planned: Oral ETT  Additional Equipment:   Intra-op Plan:   Post-operative Plan: Extubation in OR  Informed Consent: I have reviewed the patients History and Physical, chart, labs and discussed the procedure including the risks, benefits and alternatives for the proposed anesthesia with the patient or authorized representative who has indicated his/her understanding and acceptance.     Dental advisory given  Plan Discussed with: CRNA  Anesthesia Plan Comments:        Anesthesia Quick Evaluation

## 2023-11-05 NOTE — Discharge Instructions (Signed)

## 2023-11-05 NOTE — Op Note (Signed)
PATIENT:  Eddie Nunez  PRE-OPERATIVE DIAGNOSIS:  Adenocarcinoma of the prostate  POST-OPERATIVE DIAGNOSIS:  Same  PROCEDURE:  Procedure(s): 1. I-125 radioactive seed implantation 2. SpaceOAR implantation. 3.  Cystoscopy  SURGEON:  Surgeon(s): Bjorn Pippin MD  Radiation oncologist: Dr. Margaretmary Dys  ANESTHESIA:  General  EBL:  Minimal  DRAINS: 16 French Foley catheter  INDICATION: Eddie Nunez is a 73 y.o. with Stage T1c N0 M0, Gleason Gleason 7(4+3)  prostate cancer who has elected brachytherapy for treatment.  Description of procedure: After informed consent the patient was brought to the major OR, placed on the table and administered general anesthesia. He was then moved to the modified lithotomy position with his perineum perpendicular to the floor. His perineum and genitalia were then sterilely prepped. An official timeout was then performed. A 16 French Foley catheter was then placed in the bladder and filled with dilute contrast, a rectal tube was placed in the rectum and the transrectal ultrasound probe was placed in the rectum and affixed to the stand. He was then sterilely draped.  The sterile grid was installed.   Anchor needles were then placed.   Real time ultrasonography was used along with the seed planning software spot-pro version 3.1-00. This was used to develop the seed plan including the number of needles as well as number of seeds required for complete and adequate coverage. Real-time ultrasonography was then used along with the previously developed plan  to implant a total of 61 seeds using 18 needles for a target dose of 145 Gy. This proceeded without difficulty or complication.  The anchor needles and guide were removed and the SpaceOAR needle was passed under US guidance into the fat stripe posterior to the prostate with the tip in the midline at mid prostate. A puff of NS confirmed appropriate positioning and the SpaceOAR polymer was then injected over 10  seconds into the space with excellent distribution.     A Foley catheter was then removed as well as the transrectal ultrasound probe and rectal probe. Flexible cystoscopy was then performed using the 17 French flexible scope which revealed a normal urethra throughout its length down to the sphincter which appeared intact. The prostatic urethra was 2-3cm with bilobar hyperplasia. The bladder was then entered and fully and systematically inspected.  The ureteral orifices were noted to be of normal configuration and position. The mucosa revealed no evidence of tumors.  There is mild trabeculation.  There were also no stones identified within the bladder.  No  seeds or spacers were seen and/or removed from the bladder.  The cystoscope was then removed.  The drapes were removed.  The perineum was cleaned and dressed.  He was taken out of the lithotomy position and was awakened and taken to recovery room in stable and satisfactory condition. He tolerated procedure well and there were no intraoperative complications.

## 2023-11-05 NOTE — Progress Notes (Signed)
  Radiation Oncology         [phone] ________________________________  Name: AADAM ZHEN MRN: [id number]  Date: 11/05/2023  DOB: September 18, 1951       Prostate Seed Implant  WU:[id number], Gerlene Burdock, MD  No ref. provider found  DIAGNOSIS:   73 y.o. gentleman with Stage T1c adenocarcinoma of the prostate with Gleason score of 4+3, and PSA of 6.60.   Oncology History  Malignant neoplasm of prostate (HCC)  06/12/2023 Cancer Staging   Staging form: Prostate, AJCC 8th Edition - Clinical stage from 06/12/2023: Stage IIC (cT1c, cN0, cM0, PSA: 6.9, Grade Group: 3) - Signed by Marcello Fennel, PA-C on 08/22/2023 Histopathologic type: Adenocarcinoma, NOS Stage prefix: Initial diagnosis Prostate specific antigen (PSA) range: Less than 10 Gleason primary pattern: 4 Gleason secondary pattern: 3 Gleason score: 7 Histologic grading system: 5 grade system Number of biopsy cores examined: 12 Number of biopsy cores positive: 9 Location of positive needle core biopsies: Both sides   08/22/2023 Initial Diagnosis   Malignant neoplasm of prostate (HCC)     No diagnosis found.  PROCEDURE: Insertion of radioactive I-125 seeds into the prostate gland.  RADIATION DOSE: 145 Gy, definitive therapy.  TECHNIQUE: PADEN SENGER was brought to the operating room with the urologist. He was placed in the dorsolithotomy position. He was catheterized and a rectal tube was inserted. The perineum was shaved, prepped and draped. The ultrasound probe was then introduced by me into the rectum to see the prostate gland.  TREATMENT DEVICE: I attached the needle grid to the ultrasound probe stand and anchor needles were placed.  3D PLANNING: The prostate was imaged in 3D using a sagittal sweep of the prostate probe. These images were transferred to the planning computer. There, the prostate, urethra and rectum were defined on each axial reconstructed image. Then, the software created an optimized 3D plan and a few seed  positions were adjusted. The quality of the plan was reviewed using [facility] information for the target and the following two organs at risk:  Urethra and Rectum.  Then the accepted plan was printed and handed off to the radiation therapist.  Under my supervision, the custom loading of the seeds and spacers was carried out using the quick loader.  These pre-loaded needles were then placed into the needle holder.Marland Kitchen  PROSTATE VOLUME STUDY:  Using transrectal ultrasound the volume of the prostate was verified to be 36.6 cc.  SPECIAL TREATMENT PROCEDURE/SUPERVISION AND HANDLING: The pre-loaded needles were then delivered by the urologist under sagittal guidance. A total of 18 needles were used to deposit 61 seeds in the prostate gland. The individual seed activity was 0.443 mCi.  SpaceOAR:  Yes  COMPLEX SIMULATION: At the end of the procedure, an anterior radiograph of the pelvis was obtained to document seed positioning and count. Cystoscopy was performed by the urologist to check the urethra and bladder.  MICRODOSIMETRY: At the end of the procedure, the patient was emitting 0.08 mR/hr at 1 meter. Accordingly, he was considered safe for hospital discharge.  PLAN: The patient will return to the radiation oncology clinic for post implant CT dosimetry in three weeks.   ________________________________  Artist Pais Kathrynn Running, M.D.

## 2023-11-05 NOTE — Anesthesia Postprocedure Evaluation (Signed)
Anesthesia Post Note  Patient: Eddie Nunez  Procedure(s) Performed: RADIOACTIVE SEED IMPLANT/BRACHYTHERAPY IMPLANT (Prostate) SPACE OAR INSTILLATION (Perineum)     Patient location during evaluation: PACU Anesthesia Type: General Level of consciousness: awake and alert Pain management: pain level controlled Vital Signs Assessment: post-procedure vital signs reviewed and stable Respiratory status: spontaneous breathing, nonlabored ventilation and respiratory function stable Cardiovascular status: blood pressure returned to baseline and stable Postop Assessment: no apparent nausea or vomiting Anesthetic complications: no   No notable events documented.  Last Vitals:  Vitals:   11/05/23 1230 11/05/23 1315  BP: 120/63 116/62  Pulse: (!) 57 65  Resp: 13 15  Temp: (!) 36.3 C (!) 36.2 C  SpO2: 95% 93%    Last Pain:  Vitals:   11/05/23 1315  TempSrc:   PainSc: 0-No pain                 Lowella Curb

## 2023-11-05 NOTE — Interval H&P Note (Signed)
History and Physical Interval Note: no change  11/05/2023 9:06 AM  Eddie Nunez  has presented today for surgery, with the diagnosis of PROSTATE CANCER.  The various methods of treatment have been discussed with the patient and family. After consideration of risks, benefits and other options for treatment, the patient has consented to  Procedure(s) with comments: RADIOACTIVE SEED IMPLANT/BRACHYTHERAPY IMPLANT (N/A) - 90 MINS FOR CASE SPACE OAR INSTILLATION (N/A) as a surgical intervention.  The patient's history has been reviewed, patient examined, no change in status, stable for surgery.  I have reviewed the patient's chart and labs.  Questions were answered to the patient's satisfaction.     Bjorn Pippin

## 2023-11-06 ENCOUNTER — Encounter (HOSPITAL_BASED_OUTPATIENT_CLINIC_OR_DEPARTMENT_OTHER): Payer: Self-pay | Admitting: Urology

## 2023-11-11 NOTE — Progress Notes (Signed)
Patient was a RadOnc Consult on 08/23/23 for his stage T1c adenocarcinoma of the prostate with Gleason score of 4+3, and PSA of 6.60.  Patient proceed with treatment recommendations of brachytherapy and had his treatment on 11/05/23.   Patient is scheduled for a post seed CT Sim on 2/19 and has post operative follow up with urology on 2/18.

## 2023-11-20 DIAGNOSIS — R338 Other retention of urine: Secondary | ICD-10-CM | POA: Diagnosis not present

## 2023-11-26 ENCOUNTER — Telehealth: Payer: Self-pay | Admitting: *Deleted

## 2023-11-26 ENCOUNTER — Encounter: Payer: Self-pay | Admitting: Urology

## 2023-11-26 DIAGNOSIS — R338 Other retention of urine: Secondary | ICD-10-CM | POA: Diagnosis not present

## 2023-11-26 NOTE — Progress Notes (Signed)
 Post-seed nursing interview for a diagnosis of Malignant neoplasm of prostate (HCC).  Patient identity verified x2.   Patient reports dysuria has now subsided. Patient denies all other related issues at this time.  Meaningful use complete.  I-PSS score - Patient has a urinary cath currently. SHIM score- 15 Urinary Management medication(s) Tamsulosin Urology appointment date- 11/2023 with Dr. Annabell Howells at [facility]  Vitals- Ht 5\' 10"  (1.778 m)   Wt 195 lb (88.5 kg)   BMI 27.98 kg/m   This concludes the interaction.  Ruel Favors, LPN

## 2023-11-26 NOTE — Telephone Encounter (Signed)
 CALLED PATIENT TO REMIND OF POST SEED APPTS. FOR 11-27-23, SPOKE WITH PATIENT AND HE IS AWARE OF THESE APPTS.

## 2023-11-27 ENCOUNTER — Ambulatory Visit
Admission: RE | Admit: 2023-11-27 | Discharge: 2023-11-27 | Disposition: A | Discharge status: Home / Self Care | Payer: Medicare Other | Source: Ambulatory Visit | Attending: Radiation Oncology | Admitting: Radiation Oncology

## 2023-11-27 ENCOUNTER — Ambulatory Visit
Admission: RE | Admit: 2023-11-27 | Discharge: 2023-11-27 | Disposition: A | Discharge status: Home / Self Care | Payer: Medicare Other | Source: Ambulatory Visit | Attending: Urology | Admitting: Urology

## 2023-11-27 ENCOUNTER — Encounter: Payer: Self-pay | Admitting: Urology

## 2023-11-27 VITALS — BP 142/82 | HR 61 | Temp 97.3°F | Resp 18 | Ht 70.0 in | Wt 195.0 lb

## 2023-11-27 DIAGNOSIS — C61 Malignant neoplasm of prostate: Secondary | ICD-10-CM | POA: Insufficient documentation

## 2023-11-27 NOTE — Progress Notes (Signed)
 Radiation Oncology         [phone] ________________________________  Name: Eddie Nunez MRN: [id number]  Date: 11/27/2023  DOB: December 18, 1950  Post-Seed Follow-Up Visit Note  CC: Geoffry Paradise, MD  Bjorn Pippin, MD  Diagnosis:   73 y.o. gentleman with Stage T1c adenocarcinoma of the prostate with Gleason score of 4+3, and PSA of 6.60.     ICD-10-CM   1. Malignant neoplasm of prostate (HCC)  C61       Interval Since Last Radiation:  3 weeks 11/05/23:  Insertion of radioactive I-125 seeds into the prostate gland; 145 Gy, definitive therapy with placement of SpaceOAR gel.  Narrative:  I spoke with the patient to conduct his routine scheduled follow up visit via telephone to spare the patient unnecessary travel risks associated with the impending winter storm predicted for today.  The patient was notified in advance and gave permission to proceed with this visit format.  Unfortunately, he went into acute urinary retention approximately 2 weeks following his procedure and had a Foley catheter placed on 11/20/2023.  The catheter has continued draining well and he is tolerating it fairly well.  His pre-implant score was 7. He denies any abdominal pain or bowel symptoms.  He had a follow-up visit with Anne Fu, NP on 11/26/2023 and is scheduled for a voiding trial on 12/03/2023, in hopes of having the Foley catheter removed at that time.  Otherwise, he is pleased with his progress to date.  ALLERGIES:  has no known allergies.  Meds: Current Outpatient Medications  Medication Sig Dispense Refill   tamsulosin (FLOMAX) 0.4 MG CAPS capsule Take 0.4 mg by mouth daily.     HYDROcodone-acetaminophen (NORCO/VICODIN) 5-325 MG tablet Take 1 tablet by mouth every 6 (six) hours as needed for moderate pain (pain score 4-6). 6 tablet 0   rosuvastatin (CRESTOR) 10 MG tablet TAKE ONE TABLET EACH DAY for 30     sildenafil (VIAGRA) 100 MG tablet Take 0.5 tablets (50 mg total) by mouth daily as needed  for up to 10 days for erectile dysfunction. 10 tablet 2   No current facility-administered medications for this encounter.    Physical Findings: Unable to assess due to telephone follow-up visit format.  Lab Findings: No results found for: "WBC", "HGB", "HCT", "MCV", "PLT"  Radiographic Findings:  Patient underwent CT imaging in our clinic for post implant dosimetry. The CT will be reviewed by Dr. Kathrynn Running to confirm there is an adequate distribution of radioactive seeds throughout the prostate gland and ensure that there are no seeds in or near the rectum.  We suspect the final radiation plan and dosimetry will show appropriate coverage of the prostate gland. He understands that we will call and inform him of any unexpected findings on further review of his imaging and dosimetry.  Impression/Plan: 73 y.o. gentleman with Stage T1c adenocarcinoma of the prostate with Gleason score of 4+3, and PSA of 6.60.  The patient is recovering from the effects of radiation. His urinary symptoms should gradually improve over the next 4-6 months. We talked about this today.  Unfortunately he went into acute urinary retention requiring Foley catheter placement following his procedure, but he is now taking Flomax as directed and hopes to have the Foley catheter removed next week.  He is otherwise pleased with his outcome. We also talked about long-term follow-up for prostate cancer following seed implant. He understands that ongoing PSA determinations and digital rectal exams will help perform surveillance to rule out disease recurrence.  He has a follow up appointment scheduled with Anne Fu, NP on 12/03/2023 for voiding trial in hopes of having the Foley catheter removed.  He will then be scheduled for routine follow-up with Dr. Annabell Howells in May 2025, following his first posttreatment PSA check.  He understands what to expect with his PSA measures. Patient was also educated today about some of the long-term effects  from radiation including a small risk for rectal bleeding and possibly erectile dysfunction. We talked about some of the general management approaches to these potential complications. However, I did encourage the patient to contact our office or return at any point if he has questions or concerns related to his previous radiation and prostate cancer.  I personally spent 20 minutes in this encounter including chart review, reviewing radiological studies, meeting telephone conversation with the patient, coordinating care and completing documentation.    Marguarite Arbour, PA-C

## 2023-11-27 NOTE — Progress Notes (Signed)
  Radiation Oncology         [phone] ________________________________  Name: Eddie Nunez MRN: [id number]  Date: 11/27/2023  DOB: May 20, 1951  COMPLEX SIMULATION NOTE  NARRATIVE:  The patient was brought to the CT Simulation planning suite today following prostate seed implantation approximately one month ago.  Identity was confirmed.  All relevant records and images related to the planned course of therapy were reviewed.  Then, the patient was set-up supine.  CT images were obtained.  The CT images were loaded into the planning software.  Then the prostate and rectum were contoured.  Treatment planning then occurred.  The implanted iodine 125 seeds were identified by the physics staff for projection of radiation distribution  I have requested : 3D Simulation  I have requested a DVH of the following structures: Prostate and rectum.    ________________________________  Artist Pais Kathrynn Running, M.D.

## 2023-11-29 NOTE — Addendum Note (Signed)
 Encounter addended by: Margaretmary Dys, MD on: 11/29/2023 10:10 AM  Actions taken: Problem List reviewed, Medication List reviewed, Allergies reviewed

## 2023-12-02 ENCOUNTER — Encounter: Payer: Self-pay | Admitting: *Deleted

## 2023-12-05 ENCOUNTER — Encounter: Payer: Self-pay | Admitting: Radiation Oncology

## 2023-12-05 DIAGNOSIS — C61 Malignant neoplasm of prostate: Secondary | ICD-10-CM | POA: Diagnosis not present

## 2023-12-05 DIAGNOSIS — Z191 Hormone sensitive malignancy status: Secondary | ICD-10-CM | POA: Diagnosis not present

## 2023-12-05 NOTE — Radiation Completion Notes (Signed)
 Patient Name: DEAIRE, MCWHIRTER MRN: [id number] Date of Birth: [birth year] Referring Physician: Bjorn Pippin, M.D. Date of Service: 2023-12-05 Radiation Oncologist: Margaretmary Bayley, M.D. [facility]                             RADIATION ONCOLOGY END OF TREATMENT NOTE     Diagnosis: C61 Malignant neoplasm of prostate Staging on 2023-06-12: Malignant neoplasm of prostate (HCC) T=cT1c, N=cN0, M=cM0 Intent: Curative     ==========DELIVERED PLANS==========  Prostate Seed Implant Date: 2023-11-05   Plan Name: Prostate Seed Implant Site: Prostate Technique: Radioactive Seed Implant I-125 Mode: Brachytherapy Dose Per Fraction: 145 Gy Prescribed Dose (Delivered / Prescribed): 145 Gy / 145 Gy Prescribed Fxs (Delivered / Prescribed): 1 / 1     ==========ON TREATMENT VISIT DATES========== 2023-11-05     ==========UPCOMING VISITS==========

## 2023-12-05 NOTE — Progress Notes (Signed)
  Radiation Oncology         [phone] ________________________________  Name: Eddie Nunez MRN: [id number]  Date: 12/05/2023  DOB: 01/20/1951  3D Planning Note   Prostate Brachytherapy Post-Implant Dosimetry  Diagnosis: 73 y.o. gentleman with Stage T1c adenocarcinoma of the prostate with Gleason score of 4+3, and PSA of 6.60.  Narrative: On a previous date, HALSTON KINTZ returned following prostate seed implantation for post implant planning. He underwent CT scan complex simulation to delineate the three-dimensional structures of the pelvis and demonstrate the radiation distribution.  Since that time, the seed localization, and complex isodose planning with dose volume histograms have now been completed.  Results:   Prostate Coverage - The dose of radiation delivered to the 90% or more of the prostate gland (D90) was 109.18% of the prescription dose. This exceeds our goal of greater than 90%. Rectal Sparing - The volume of rectal tissue receiving the prescription dose or higher was 0.0 cc. This falls under our thresholds tolerance of 1.0 cc.  Impression: The prostate seed implant appears to show adequate target coverage and appropriate rectal sparing.  Plan:  The patient will continue to follow with urology for ongoing PSA determinations. I would anticipate a high likelihood for local tumor control with minimal risk for rectal morbidity.  ________________________________  Artist Pais Kathrynn Running, M.D.

## 2023-12-09 DIAGNOSIS — C61 Malignant neoplasm of prostate: Secondary | ICD-10-CM | POA: Diagnosis not present

## 2023-12-19 ENCOUNTER — Inpatient Hospital Stay: Payer: TRICARE For Life (TFL) | Attending: Adult Health | Admitting: *Deleted

## 2023-12-19 ENCOUNTER — Encounter: Payer: Self-pay | Admitting: *Deleted

## 2023-12-19 DIAGNOSIS — C61 Malignant neoplasm of prostate: Secondary | ICD-10-CM

## 2023-12-19 NOTE — Progress Notes (Addendum)
 SCP reviewed and completed. Pt states that he has an indwelling catheter because of the inability to urinate on his own x  4 weeks. Pt now takes tamsulosin x2. Pt will visit [facility] on 3/17 with PA to see if he can pass the voiding trial and  perhaps get the catheter removed. Pt has resumed exercise activities including swimming, walking and tennis as well weekly. Diet was also discussed and included the resources we have available here at [facility]. Last colonoscopy was 01/27/2021.Vaccines discussed and medications reconciled.

## 2024-02-05 DIAGNOSIS — N1831 Chronic kidney disease, stage 3a: Secondary | ICD-10-CM | POA: Diagnosis not present

## 2024-02-05 DIAGNOSIS — R82998 Other abnormal findings in urine: Secondary | ICD-10-CM | POA: Diagnosis not present

## 2024-02-07 DIAGNOSIS — C61 Malignant neoplasm of prostate: Secondary | ICD-10-CM | POA: Diagnosis not present

## 2024-02-14 DIAGNOSIS — R8271 Bacteriuria: Secondary | ICD-10-CM | POA: Diagnosis not present

## 2024-02-14 DIAGNOSIS — C61 Malignant neoplasm of prostate: Secondary | ICD-10-CM | POA: Diagnosis not present

## 2024-02-14 DIAGNOSIS — R338 Other retention of urine: Secondary | ICD-10-CM | POA: Diagnosis not present

## 2024-03-04 DIAGNOSIS — R338 Other retention of urine: Secondary | ICD-10-CM | POA: Diagnosis not present

## 2024-03-09 DIAGNOSIS — N400 Enlarged prostate without lower urinary tract symptoms: Secondary | ICD-10-CM | POA: Diagnosis not present

## 2024-03-12 ENCOUNTER — Other Ambulatory Visit: Payer: Self-pay | Admitting: Urology

## 2024-03-13 ENCOUNTER — Encounter (HOSPITAL_COMMUNITY): Payer: Self-pay

## 2024-03-13 NOTE — Patient Instructions (Addendum)
 SURGICAL WAITING ROOM VISITATION Patients having surgery or a procedure may have no more than 2 support people in the waiting area - these visitors may rotate.    Children under the age of 32 must have an adult with them who is not the patient.  If the patient needs to stay at the hospital during part of their recovery, the visitor guidelines for inpatient rooms apply. Pre-op nurse will coordinate an appropriate time for 1 support person to accompany patient in pre-op.  This support person may not rotate.    Please refer to the [facility] website for the visitor guidelines for Inpatients (after your surgery is over and you are in a regular room).       Your procedure is scheduled on: 03-24-24   Report to [facility] Main Entrance    Report to admitting at 8:00 AM   Call this number if you have problems the morning of surgery [phone]   Do not eat food or drink liquids :After Midnight.           If you have questions, please contact your surgeon's office.   FOLLOW ANY ADDITIONAL PRE OP INSTRUCTIONS YOU RECEIVED FROM YOUR SURGEON'S OFFICE!!!     Oral Hygiene is also important to reduce your risk of infection.                                    Remember - BRUSH YOUR TEETH THE MORNING OF SURGERY WITH YOUR REGULAR TOOTHPASTE   Do NOT smoke after Midnight   Take these medicines the morning of surgery with A SIP OF WATER :    Rosuvastatin  Stop all vitamins and herbal supplements 7 days before surgery                              You may not have any metal on your body including jewelry, and body piercing             Do not wear  lotions, powders, cologne, or deodorant              Men may shave face and neck.   Do not bring valuables to the hospital. [facility] IS NOT RESPONSIBLE   FOR VALUABLES.   Contacts, dentures or bridgework may not be worn into surgery.   DO NOT BRING YOUR HOME MEDICATIONS TO THE HOSPITAL. PHARMACY WILL DISPENSE MEDICATIONS LISTED ON YOUR  MEDICATION LIST TO YOU DURING YOUR ADMISSION IN THE HOSPITAL!    Patients discharged on the day of surgery will not be allowed to drive home.  Someone NEEDS to stay with you for the first 24 hours after anesthesia.   Special Instructions: Bring a copy of your healthcare power of attorney and living will documents the day of surgery if you haven't scanned them before.              Please read over the following fact sheets you were given: IF YOU HAVE QUESTIONS ABOUT YOUR PRE-OP INSTRUCTIONS PLEASE CALL [phone] [facility]  If you received a COVID test during your pre-op visit  it is requested that you wear a mask when out in public, stay away from anyone that may not be feeling well and notify your surgeon if you develop symptoms. If you test positive for Covid or have been in contact with anyone that has tested positive  in the last 10 days please notify you surgeon.  [place] - Preparing for Surgery Before surgery, you can play an important role.  Because skin is not sterile, your skin needs to be as free of germs as possible.  You can reduce the number of germs on your skin by washing with CHG (chlorahexidine gluconate) soap before surgery.  CHG is an antiseptic cleaner which kills germs and bonds with the skin to continue killing germs even after washing. Please DO NOT use if you have an allergy to CHG or antibacterial soaps.  If your skin becomes reddened/irritated stop using the CHG and inform your nurse when you arrive at Short Stay. Do not shave (including legs and underarms) for at least 48 hours prior to the first CHG shower.  You may shave your face/neck.  Please follow these instructions carefully:  1.  Shower with CHG Soap the night before surgery and the  morning of surgery.  2.  If you choose to wash your hair, wash your hair first as usual with your normal  shampoo.  3.  After you shampoo, rinse your hair and body thoroughly to remove the shampoo.                             4.  Use  CHG as you would any other liquid soap.  You can apply chg directly to the skin and wash.  Gently with a scrungie or clean washcloth.  5.  Apply the CHG Soap to your body ONLY FROM THE NECK DOWN.   Do   not use on face/ open                           Wound or open sores. Avoid contact with eyes, ears mouth and   genitals (private parts).                       Wash face,  Genitals (private parts) with your normal soap.             6.  Wash thoroughly, paying special attention to the area where your    surgery  will be performed.  7.  Thoroughly rinse your body with warm water  from the neck down.  8.  DO NOT shower/wash with your normal soap after using and rinsing off the CHG Soap.                9.  Pat yourself dry with a clean towel.            10.  Wear clean pajamas.            11.  Place clean sheets on your bed the night of your first shower and do not  sleep with pets. Day of Surgery : Do not apply any lotions/deodorants the morning of surgery.  Please wear clean clothes to the hospital/surgery center.  FAILURE TO FOLLOW THESE INSTRUCTIONS MAY RESULT IN THE CANCELLATION OF YOUR SURGERY  PATIENT SIGNATURE_________________________________  NURSE SIGNATURE__________________________________  ________________________________________________________________________

## 2024-03-13 NOTE — Progress Notes (Addendum)
 COVID Vaccine Completed:  Yes  Date of COVID positive in last 90 days:  No  PCP - Suan Elm, MD Cardiologist - N/A  Chest x-ray -  N/A EKG -  N/A Stress Test - 07-19-22 [facility] ECHO -  N/A Cardiac Cath -  N/A Pacemaker/ICD device last checked: N/A Spinal Cord Stimulator: N/A Cardiac CT - 08-04-21 Epic  Bowel Prep -  N/A  Sleep Study -  N/A CPAP -   Fasting Blood Sugar -  N/A Checks Blood Sugar _____ times a day  Last dose of GLP1 agonist-  N/A GLP1 instructions:  Hold 7 days before surgery    Last dose of SGLT-2 inhibitors-  N/A SGLT-2 instructions:  Hold 3 days before surgery   Blood Thinner Instructions:   N/A Aspirin Instructions: Last Dose:  Activity level:  Can go up a flight of stairs and perform activities of daily living without stopping and without symptoms of chest pain or shortness of breath.  Able to exercise without symptoms  Anesthesia review: N/A  Patient denies shortness of breath, fever, cough and chest pain at PAT appointment  Patient verbalized understanding of instructions that were given to them at the PAT appointment. Patient was also instructed that they will need to review over the PAT instructions again at home before surgery.

## 2024-03-17 ENCOUNTER — Other Ambulatory Visit: Payer: Self-pay

## 2024-03-17 ENCOUNTER — Encounter (HOSPITAL_COMMUNITY)
Admission: RE | Admit: 2024-03-17 | Discharge: 2024-03-17 | Disposition: A | Discharge status: Home / Self Care | Source: Ambulatory Visit | Attending: Urology | Admitting: Urology

## 2024-03-17 ENCOUNTER — Encounter (HOSPITAL_COMMUNITY): Payer: Self-pay

## 2024-03-17 DIAGNOSIS — Z01818 Encounter for other preprocedural examination: Secondary | ICD-10-CM | POA: Insufficient documentation

## 2024-03-17 DIAGNOSIS — R338 Other retention of urine: Secondary | ICD-10-CM | POA: Diagnosis not present

## 2024-03-17 HISTORY — DX: Benign prostatic hyperplasia without lower urinary tract symptoms: N40.0

## 2024-03-17 HISTORY — DX: Personal history of urinary calculi: Z87.442

## 2024-03-24 ENCOUNTER — Ambulatory Visit (HOSPITAL_COMMUNITY): Payer: Self-pay | Admitting: Certified Registered"

## 2024-03-24 ENCOUNTER — Ambulatory Visit (HOSPITAL_COMMUNITY)
Admission: RE | Admit: 2024-03-24 | Discharge: 2024-03-24 | Disposition: A | Discharge status: Home / Self Care | Attending: Urology | Admitting: Urology

## 2024-03-24 ENCOUNTER — Encounter (HOSPITAL_COMMUNITY)
Admission: RE | Disposition: A | Discharge status: Home / Self Care | Payer: Self-pay | Source: Home / Self Care | Attending: Urology

## 2024-03-24 ENCOUNTER — Encounter (HOSPITAL_COMMUNITY): Payer: Self-pay | Admitting: Urology

## 2024-03-24 DIAGNOSIS — Z79899 Other long term (current) drug therapy: Secondary | ICD-10-CM | POA: Diagnosis not present

## 2024-03-24 DIAGNOSIS — R339 Retention of urine, unspecified: Secondary | ICD-10-CM

## 2024-03-24 DIAGNOSIS — R338 Other retention of urine: Secondary | ICD-10-CM | POA: Insufficient documentation

## 2024-03-24 DIAGNOSIS — N401 Enlarged prostate with lower urinary tract symptoms: Secondary | ICD-10-CM

## 2024-03-24 DIAGNOSIS — C61 Malignant neoplasm of prostate: Secondary | ICD-10-CM | POA: Insufficient documentation

## 2024-03-24 HISTORY — PX: CYSTOSCOPY WITH INSERTION OF UROLIFT: SHX6678

## 2024-03-24 SURGERY — CYSTOSCOPY WITH INSERTION OF UROLIFT
Anesthesia: General

## 2024-03-24 MED ORDER — CEFAZOLIN SODIUM-DEXTROSE 2-4 GM/100ML-% IV SOLN
2.0000 g | INTRAVENOUS | Status: AC
  Administered 2024-03-24: 2 g via INTRAVENOUS
  Filled 2024-03-24: qty 100

## 2024-03-24 MED ORDER — HYDROCODONE-ACETAMINOPHEN 5-325 MG PO TABS: 1.0000 | ORAL_TABLET | Freq: Four times a day (QID) | ORAL | 0 refills | Status: AC | PRN

## 2024-03-24 MED ORDER — FENTANYL CITRATE (PF) 100 MCG/2ML IJ SOLN
INTRAMUSCULAR | Status: DC | PRN
  Administered 2024-03-24: 50 ug via INTRAVENOUS
  Administered 2024-03-24: 25 ug via INTRAVENOUS

## 2024-03-24 MED ORDER — SODIUM CHLORIDE 0.9% FLUSH: 3.0000 mL | Freq: Two times a day (BID) | INTRAVENOUS | Status: DC

## 2024-03-24 MED ORDER — SODIUM CHLORIDE 0.9 % IR SOLN
Status: DC | PRN
  Administered 2024-03-24: 3000 mL via INTRAVESICAL

## 2024-03-24 MED ORDER — ONDANSETRON HCL 4 MG/2ML IJ SOLN
INTRAMUSCULAR | Status: AC
  Filled 2024-03-24: qty 2

## 2024-03-24 MED ORDER — ONDANSETRON HCL 4 MG/2ML IJ SOLN
INTRAMUSCULAR | Status: DC | PRN
  Administered 2024-03-24: 4 mg via INTRAVENOUS

## 2024-03-24 MED ORDER — SULFAMETHOXAZOLE-TRIMETHOPRIM 800-160 MG PO TABS: 1.0000 | ORAL_TABLET | Freq: Two times a day (BID) | ORAL | 0 refills | Status: AC

## 2024-03-24 MED ORDER — CHLORHEXIDINE GLUCONATE 0.12 % MT SOLN
15.0000 mL | Freq: Once | OROMUCOSAL | Status: AC
  Administered 2024-03-24: 15 mL via OROMUCOSAL

## 2024-03-24 MED ORDER — FENTANYL CITRATE (PF) 100 MCG/2ML IJ SOLN
INTRAMUSCULAR | Status: AC
  Filled 2024-03-24: qty 2

## 2024-03-24 MED ORDER — PROPOFOL 10 MG/ML IV BOLUS
INTRAVENOUS | Status: AC
  Filled 2024-03-24: qty 20

## 2024-03-24 MED ORDER — EPHEDRINE SULFATE-NACL 50-0.9 MG/10ML-% IV SOSY
PREFILLED_SYRINGE | INTRAVENOUS | Status: DC | PRN
  Administered 2024-03-24: 5 mg via INTRAVENOUS
  Administered 2024-03-24: 10 mg via INTRAVENOUS

## 2024-03-24 MED ORDER — KETOROLAC TROMETHAMINE 15 MG/ML IJ SOLN: 15.0000 mg | Freq: Once | INTRAMUSCULAR | Status: DC | PRN

## 2024-03-24 MED ORDER — ONDANSETRON HCL 4 MG/2ML IJ SOLN: 4.0000 mg | Freq: Once | INTRAMUSCULAR | Status: DC | PRN

## 2024-03-24 MED ORDER — LIDOCAINE HCL (CARDIAC) PF 100 MG/5ML IV SOSY
PREFILLED_SYRINGE | INTRAVENOUS | Status: DC | PRN
  Administered 2024-03-24: 40 mg via INTRAVENOUS

## 2024-03-24 MED ORDER — ORAL CARE MOUTH RINSE: 15.0000 mL | Freq: Once | OROMUCOSAL | Status: AC

## 2024-03-24 MED ORDER — LIDOCAINE HCL (PF) 2 % IJ SOLN
INTRAMUSCULAR | Status: AC
  Filled 2024-03-24: qty 5

## 2024-03-24 MED ORDER — LACTATED RINGERS IV SOLN: INTRAVENOUS | Status: DC

## 2024-03-24 MED ORDER — DEXAMETHASONE SODIUM PHOSPHATE 10 MG/ML IJ SOLN
INTRAMUSCULAR | Status: AC
  Filled 2024-03-24: qty 1

## 2024-03-24 MED ORDER — PROPOFOL 10 MG/ML IV BOLUS
INTRAVENOUS | Status: DC | PRN
  Administered 2024-03-24: 150 mg via INTRAVENOUS

## 2024-03-24 MED ORDER — DEXAMETHASONE SODIUM PHOSPHATE 10 MG/ML IJ SOLN
INTRAMUSCULAR | Status: DC | PRN
  Administered 2024-03-24: 4 mg via INTRAVENOUS

## 2024-03-24 MED ORDER — AMISULPRIDE (ANTIEMETIC) 5 MG/2ML IV SOLN: 10.0000 mg | Freq: Once | INTRAVENOUS | Status: DC | PRN

## 2024-03-24 MED ORDER — FENTANYL CITRATE PF 50 MCG/ML IJ SOSY: 25.0000 ug | PREFILLED_SYRINGE | INTRAMUSCULAR | Status: DC | PRN

## 2024-03-24 MED ORDER — ACETAMINOPHEN 10 MG/ML IV SOLN: 1000.0000 mg | Freq: Once | INTRAVENOUS | Status: DC | PRN

## 2024-03-24 SURGICAL SUPPLY — 15 items
BAG COUNTER SPONGE SURGICOUNT (BAG) IMPLANT
BAG URINE DRAIN 2000ML AR STRL (UROLOGICAL SUPPLIES) ×2 IMPLANT
BAG URO CATCHER STRL LF (MISCELLANEOUS) ×2 IMPLANT
CATH FOLEY 2WAY SLVR 5CC 18FR (CATHETERS) ×2 IMPLANT
GLOVE SURG LX STRL 8.0 MICRO (GLOVE) ×2 IMPLANT
GOWN STRL REUS W/ TWL XL LVL3 (GOWN DISPOSABLE) ×4 IMPLANT
HOLDER FOLEY CATH W/STRAP (MISCELLANEOUS) ×2 IMPLANT
KIT TURNOVER KIT A (KITS) ×4 IMPLANT
MANIFOLD NEPTUNE II (INSTRUMENTS) ×2 IMPLANT
PACK CYSTO (CUSTOM PROCEDURE TRAY) ×2 IMPLANT
PENCIL SMOKE EVACUATOR (MISCELLANEOUS) IMPLANT
SYSTEM UROLIFT 2 CART W/ HNDL (Male Continence) IMPLANT
SYSTEM UROLIFT 2 CARTRIDGE (Male Continence) IMPLANT
TUBING CONNECTING 10 (TUBING) ×2 IMPLANT
WATER STERILE IRR 3000ML UROMA (IV SOLUTION) ×4 IMPLANT

## 2024-03-24 NOTE — Discharge Instructions (Addendum)
 You may remove the catheter in the morning using the included instructions.    I don't see that we have a post op follow appointment scheduled other than your routine PSA follow up on 8/11.  I would general see someone about 2-3 weeks after this procedure, but if you are doing well in the postop period, we can just use the 05/18/24 appointment for that.  Please call if you need to be seen sooner.

## 2024-03-24 NOTE — Anesthesia Preprocedure Evaluation (Signed)
 Anesthesia Evaluation  Patient identified by MRN, date of birth, ID band Patient awake    Reviewed: Allergy & Precautions, NPO status , Patient's Chart, lab work & pertinent test results  Airway Mallampati: III  TM Distance: >3 FB Neck ROM: Full    Dental no notable dental hx.    Pulmonary neg pulmonary ROS   Pulmonary exam normal        Cardiovascular negative cardio ROS Normal cardiovascular exam     Neuro/Psych negative neurological ROS  negative psych ROS   GI/Hepatic negative GI ROS, Neg liver ROS,,,  Endo/Other  negative endocrine ROS    Renal/GU Renal disease     Musculoskeletal negative musculoskeletal ROS (+)    Abdominal   Peds  Hematology negative hematology ROS (+)   Anesthesia Other Findings BENGIN PROSTATIC HYPERPLASIA WITH URINARY RETENTION  Reproductive/Obstetrics                             Anesthesia Physical Anesthesia Plan  ASA: 2  Anesthesia Plan: General   Post-op Pain Management:    Induction: Intravenous  PONV Risk Score and Plan: 2 and Ondansetron , Dexamethasone  and Treatment may vary due to age or medical condition  Airway Management Planned: LMA  Additional Equipment:   Intra-op Plan:   Post-operative Plan: Extubation in OR  Informed Consent: I have reviewed the patients History and Physical, chart, labs and discussed the procedure including the risks, benefits and alternatives for the proposed anesthesia with the patient or authorized representative who has indicated his/her understanding and acceptance.     Dental advisory given  Plan Discussed with: CRNA  Anesthesia Plan Comments:        Anesthesia Quick Evaluation

## 2024-03-24 NOTE — Op Note (Signed)
 Procedure: Cystoscopy with UroLift implant.  Preop diagnosis: Urinary retention.  Postop diagnosis: Same.  Surgeon: Dr. Homero Luster.  Anesthesia: General.  Specimen: None.  Drains: 18 French Foley catheter.  EBL: None.  Complications: None.  Indications: The patient is a 73 year old male with a history of prostate cancer treated with seeds earlier this year who has been in retention on self catheterizations for several months.  He has failed medical therapy.  Because he is within a year of the seed implant was felt the most appropriate option was to use the UroLift to try to relieve the retention.  Procedure: He was taken operating room was given 2 g of Ancef.  A general anesthetic was induced.  He was placed in lithotomy position and fitted with PAS hose.  His perineum and genitalia were prepped with Betadine solution was draped in usual sterile fashion.  The UroLift scope was passed with the visual obturator and the prostatic urethra and bladder were inspected.  The external sphincter was intact but had some evidence of irritation from self-catheterization.  The prostatic urethra short with lateral lobe hyperplasia with coaptation and some slight elevation of the bladder neck.  Examination of bladder demonstrated mild trabeculation without mucosal lesions.  Ureteral orifices were unremarkable.  After completion of the cystoscopic inspection the first UroLift device was inserted and positioned approximately 1 to 1-1/2 cm distal to the bladder neck.  The tip of the device  was rotated anteriorly to the right toward the anterior commissure and then swept down to open an anterior channel.  The device was angled 10 degrees with pressure and the first trigger pulled was performed.  It was then angled to 20 degrees with pressure in the 2 additional trigger pulls were applied to deploy the device and then the scope was advanced forward until the white line was visible on the suture and the fourth  trigger pole was applied to cut the suture.  This process was then repeated 3 additional times with a left proximal device placement followed by right and left mid to proximal device placements.  After deploying the 4 Urolift implants there was a good anterior channel and minimal bleeding.  The scope was removed and pressure on the bladder produced a good stream.  An 35fr foley catheter was placed and the balloon was filled with 10ml of sterile water .  The catheter drained clear irrigant and was placed to a drainage bag.  He was taken down from the lithotomy position and his anesthetic was reversed.  He was removed to the PACU in stable condition.  There were no complications.

## 2024-03-24 NOTE — Transfer of Care (Signed)
 Immediate Anesthesia Transfer of Care Note  Patient: Eddie Nunez  Procedure(s) Performed: CYSTOSCOPY WITH INSERTION OF UROLIFT  Patient Location: PACU  Anesthesia Type:General  Level of Consciousness: awake, drowsy, and patient cooperative  Airway & Oxygen Therapy: Patient Spontanous Breathing and Patient connected to face mask oxygen  Post-op Assessment: Report given to RN and Post -op Vital signs reviewed and stable  Post vital signs: Reviewed and stable  Last Vitals:  Vitals Value Taken Time  BP 139/81 03/24/24 10:31  Temp    Pulse 54 03/24/24 10:32  Resp 11 03/24/24 10:32  SpO2 100 % 03/24/24 10:32  Vitals shown include unfiled device data.  Last Pain:  Vitals:   03/24/24 0905  TempSrc:   PainSc: 0-No pain         Complications: No notable events documented.

## 2024-03-24 NOTE — H&P (Signed)
 I have prostate cancer.     02/14/24: Eddie Nunez returns today in f/u. His s/p a seed implant on 11/05/23. His PSA has declined to 1.87. He has postop retention and remains on CIC 4x daily. He didn't benefit from Tamsulosin 0.8mg  daily. Cath UA has 6-10 WBC, 20-40 RBC, many bacteria and a few yeast.   12/23/2023: Returns for follow-up evaluation. He is performing CIC up to 4 times daily with a 16 [place] In-N-Out catheter. He has had no difficulty performing the procedure. He gets anywhere from 8-10 and then may be more ounces each time he performs it. Over the past weekend he had a couple instances where he has actually been able to spontaneously void for about 10 seconds which has been a noted improvement from before. He remains on tamsulosin at 0.8 mg daily. No interval dysuria or gross hematuria.   12/09/2023: Foley catheter replaced at time of Nunez exam due to unsuccessful trial of void. His tamsulosin was increased to twice daily dosing. Now back today for follow-up evaluation/discussion.   12/03/2023: TOV deferred at time of Nunez exam until today. Continues on tamsulosin 0.4 mg daily. Returns for trial of void. Catheter continues to drain appropriately without painful/leaking urgency or gross hematuria.   11/26/2023: Foley catheter placed at time of Nunez exam on the 12th draining approximately 2 L from the bladder. He was continued on tamsulosin which he just recently started. Urine culture assessed at that time was negative for bacterial growth. Since then he has tolerated the catheter well, denies painful or leaking urgency or gross hematuria. He is having normal daily bowel movements without the use of any type of stool softeners. Urine output appropriate by his report. Denies interval fevers or chills, n/v.   11/20/2023: 73 year old man who walked into the clinic complaining of painful urination, dribbling with urination and post operative problems. He underwent seed implantation on 11/04/2023. He states  he has had a burning sensation with urination and developed difficulty voiding. He has also been leaking bowel without awareness. He states he has normal bowel movements usually. His PVR shows greater than 999 mL. A friend gave him tamsulosin 4 days ago and he has been taking this. He states the first day it worked very well and his symptoms were improved but the next few days his symptoms returned and became worse. 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   08/14/23: Eddie Nunez returns today to discuss his prostate biopsy and staging. He was found to have high volume prostate cancer with 1 core of GG3, 2 cores of GG2 and 6 cores of GG1. 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 His prostate volume is 35ml. His PSA was 6.6 prior to the biopsy but he had a PSA done after the biopsy for some reason that was 13.5 from the biopsy impact. He had a borderline ExoDx of 19 in 2022 and an MRI with only PIRADS 2 findings. His IPSS is 8 with nocturia x 1. 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 His SHIM is 20 which is consistent with mild ED.    ALLERGIES: No Allergies    MEDICATIONS: Tamsulosin HCl 0.4 MG Capsule 1 capsule PO Q HS  Flomax 0.4 MG Capsule  levoFLOXacin 750 MG Tablet 1 po 1 hour prior to the procedure  Rosuvastatin Calcium 1 tablet PO Daily     GU PSH: Prostate  Needle Biopsy - 06/12/2023       PSH Notes: Removal of bladder tumor- 1994   Left nephrectomy for organ donation    NON-GU PSH: Surgical Pathology, Gross And Microscopic Examination For Prostate Needle - 06/12/2023 Visit Complexity (formerly GPC1X) - 08/14/2023, 12/17/2022     GU PMH: Prostate Cancer - 12/23/2023, - 12/09/2023, - 12/03/2023, - 11/20/2023, - 10/22/2023, He has unfavorable intermediate risk T1c N0 M0 prostate cancer with primarily GG1 and GG2 disease but a single core of GG3 with 20% involvement. He has a 35ml prostate with  mild LUTS and mild ED. He has minimal comorbidities. I discussed Active surveillance, Cryo, HIFU, RALP, EXRT and Seeds with [facility] and reviewed the risks and benefits of each. He is most interested in Brachytherapy so I will get him set up for a consultation with Dr. Lorri Rota. , - 08/14/2023 Urinary Retention - 12/23/2023, - 12/09/2023, - 12/03/2023, - 11/26/2023, - 11/20/2023 Elevated PSA, His PSA jumped to 13.5 but that was done a week after the biopsy for some reason. This is artifact. - 08/14/2023, - 06/12/2023, His PSA continues to rise and the f/t ratio is of intermediate risk. With the further increase I think he needs a biopsy despite the MRIP and borderline ExoDx. I reviewed the risks of bleeding, infection and voiding difficulty. Levaquin sent. , - 12/17/2022, His PSA remains stable with a borderline f/t ratio of 22%. His exam is benign and the other studies point to a relatively low risk of high grade prostate cancer. I will just have him return in 6 months with another PSA. , - 06/15/2022, His PSA is down to 4.05. 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 I think at this time we can just follow his PSA's. I will have him return in 6 months with a PSA for an exam. , - 2023, He has a rising PSA with only a slight increase over the Nunez 18 months and his exam is benign. I discussed options including proceeding with a standard biopsy, doing an MRI of the prostate to assess the need for a biopsy and aid targeting or consideration of a urinary genomic test to better assess risk prior to considering a biopsy. He would like to go with the urine test so I will order an ExoDx test for him. , - 08/21/2021 BPH w/o LUTS - 06/15/2022, He has minimal LUTS., - 2023, He has BPH with mild enlargement and no LUTS. , - 08/21/2021 History of bladder cancer      PMH Notes:  1898-10-08 00:00:00 - Note: Normal Routine History And Physical Adult    HLD   NON-GU PMH: No Non-GU PMH    FAMILY HISTORY: 2  daughters - Other 2 sons - Other Death In The Family Father - Other Death In The Family Mother - Other   SOCIAL HISTORY: Marital Status: Married Preferred Language: English; Ethnicity: Not Hispanic Or Latino; Race: White Current Smoking Status: Patient has never smoked.   Tobacco Use Assessment Completed: Used Tobacco in Nunez 30 days? Does not use smokeless tobacco. Drinks 2 drinks per day.  Does not use drugs. Drinks 2 caffeinated drinks per day. Has not had a blood transfusion.     Notes: ETOH 3 gin drinks every day    REVIEW OF SYSTEMS:    GU Review Male:   Patient reports have to strain to urinate , frequent urination, and burning/ pain with urination. Patient denies trouble starting your stream, hard to  postpone urination, leakage of urine, erection problems, stream starts and stops, get up at night to urinate, and penile pain.  Gastrointestinal (Upper):   Patient denies nausea, vomiting, and indigestion/ heartburn.  Gastrointestinal (Lower):   Patient denies diarrhea and constipation.  Constitutional:   Patient denies fever, night sweats, weight loss, and fatigue.  Skin:   Patient denies skin rash/ lesion and itching.  Eyes:   Patient denies blurred vision and double vision.  Ears/ Nose/ Throat:   Patient denies sore throat and sinus problems.  Hematologic/Lymphatic:   Patient denies swollen glands and easy bruising.  Cardiovascular:   Patient denies leg swelling and chest pains.  Respiratory:   Patient denies cough and shortness of breath.  Endocrine:   Patient denies excessive thirst.  Musculoskeletal:   Patient denies back pain and joint pain.  Neurological:   Patient denies headaches and dizziness.  Psychologic:   Patient denies depression and anxiety.   VITAL SIGNS: None   Complexity of Data:  Records Review:   AUA Symptom Score, Previous Patient Records  Urine Test Review:   Urinalysis   02/07/24 06/18/23 06/12/23 03/19/23 12/10/22 06/12/22 12/11/21 06/29/21  PSA   Total PSA 1.87 ng/mL 13.50 ng/mL 6.60 ng/mL 6.92 ng/mL 5.53 ng/mL 4.35 ng/mL 4.05 ng/mL 4.536 ng/ml  Free PSA   2.26 ng/mL 1.27 ng/mL 0.94 ng/mL 0.95 ng/mL 0.76 ng/mL   % Free PSA   34 % PSA 18 % PSA 17 % PSA 22 % PSA 19 % PSA     PROCEDURES:          Visit Complexity - G2211 Chronic management         Urinalysis w/Scope Dipstick Dipstick Cont'd Micro  Color: Straw Bilirubin: Neg mg/dL WBC/hpf: 6 - 16/XWR  Appearance: Clear Ketones: Neg mg/dL RBC/hpf: 20 - 60/AVW  Specific Gravity: <=1.005 Blood: 2+ ery/uL Bacteria: Mod (26-50/hpf)  pH: <=5.0 Protein: Neg mg/dL Cystals: NS (Not Seen)  Glucose: Neg mg/dL Urobilinogen: 0.2 mg/dL Casts: NS (Not Seen)    Nitrites: Neg Trichomonas: Not Present    Leukocyte Esterase: Neg leu/uL Mucous: Not Present      Epithelial Cells: 0 - 5/hpf      Yeast: Few (1 - 5/hpf)      Sperm: Not Present    ASSESSMENT:      ICD-10 Details  1 GU:   Prostate Cancer - C61 Chronic, Stable, Improving - His PSA is staring to fall. Repeat in 3 months.   2   Urinary Retention - R33.8 Chronic, Stable - He has persistent retention requiring CIC. I will get him back for urodynamics to assess his detrusor and then will consider a Urolift if he has function.   3 NON-GU:   Bacteriuria - R82.71 Minor - He has persistent bacteriuria and candiduria on CIC. I will get him cultured and initiate treatment prior to urodynamics.      PLAN:           Orders Labs Urine Culture CATH, Urinalysis w/Scope          Schedule Labs: 3 Months - PSA    3 Months - Urinalysis  Return Visit/Planned Activity: 3 Months - Office Visit  Return Visit/Planned Activity: Next Available Appointment - Office Visit             Note: with urodynamics.   Procedure: Unspecified Date at [facility] [phone] - Urodynamics (Complex cystometrogram, w/ void pressure and urethral pressure profile studies, any technique) - 51729, 51797, [address]1600,  [address]244, [phone] Notes: Next available. Urine  culture sent today.           Document Letter(s):  Created for Patient: Clinical Summary         Notes:    I will need to see the culture results and treat prior to urodynamics.   CC: Dr. Suan Elm.         Next Appointment:      Next Appointment: 05/11/2024 03:00 PM    Appointment Type: Laboratory Appointment    Location: [facility] [phone]    Provider: Lab LAB    Reason for Visit: 3 MO PSA

## 2024-03-24 NOTE — Interval H&P Note (Signed)
 History and Physical Interval Note:  Urine culture was negative on 03/17/24.   03/24/2024 9:28 AM  Eddie Nunez  has presented today for surgery, with the diagnosis of BENGIN PROSTATIC HYPERPLASIA WITH URINARY RETENTION.  The various methods of treatment have been discussed with the patient and family. After consideration of risks, benefits and other options for treatment, the patient has consented to  Procedure(s): CYSTOSCOPY WITH INSERTION OF UROLIFT (N/A) as a surgical intervention.  The patient's history has been reviewed, patient examined, no change in status, stable for surgery.  I have reviewed the patient's chart and labs.  Questions were answered to the patient's satisfaction.     Jennilyn Esteve

## 2024-03-24 NOTE — Anesthesia Procedure Notes (Signed)
 Procedure Name: LMA Insertion Date/Time: 03/24/2024 10:01 AM  Performed by: Alwyn Juba, CRNAPre-anesthesia Checklist: Patient identified, Emergency Drugs available, Suction available, Patient being monitored and Timeout performed Patient Re-evaluated:Patient Re-evaluated prior to induction Oxygen Delivery Method: Circle system utilized Preoxygenation: Pre-oxygenation with 100% oxygen Induction Type: IV induction Ventilation: Mask ventilation without difficulty LMA: LMA inserted LMA Size: 4.0 Number of attempts: 1 Placement Confirmation: positive ETCO2 Tube secured with: Tape Dental Injury: Teeth and Oropharynx as per pre-operative assessment

## 2024-03-25 ENCOUNTER — Encounter (HOSPITAL_COMMUNITY): Payer: Self-pay | Admitting: Urology

## 2024-03-25 DIAGNOSIS — R338 Other retention of urine: Secondary | ICD-10-CM | POA: Diagnosis not present

## 2024-03-25 NOTE — Anesthesia Postprocedure Evaluation (Signed)
 Anesthesia Post Note  Patient: Eddie Nunez  Procedure(s) Performed: CYSTOSCOPY WITH INSERTION OF UROLIFT     Patient location during evaluation: PACU Anesthesia Type: General Level of consciousness: awake Pain management: pain level controlled Vital Signs Assessment: post-procedure vital signs reviewed and stable Respiratory status: spontaneous breathing, nonlabored ventilation and respiratory function stable Cardiovascular status: blood pressure returned to baseline and stable Postop Assessment: no apparent nausea or vomiting Anesthetic complications: no   No notable events documented.  Last Vitals:  Vitals:   03/24/24 1129 03/24/24 1130  BP: 126/78 126/78  Pulse: (!) 54 (!) 53  Resp:    Temp:    SpO2: 96% 95%    Last Pain:  Vitals:   03/24/24 1127  TempSrc: Oral  PainSc: 0-No pain                 Shalen Petrak P Shakea Isip

## 2024-03-27 DIAGNOSIS — R338 Other retention of urine: Secondary | ICD-10-CM | POA: Diagnosis not present

## 2024-05-08 DIAGNOSIS — C61 Malignant neoplasm of prostate: Secondary | ICD-10-CM | POA: Diagnosis not present

## 2024-05-08 DIAGNOSIS — R8271 Bacteriuria: Secondary | ICD-10-CM | POA: Diagnosis not present

## 2024-05-18 DIAGNOSIS — R338 Other retention of urine: Secondary | ICD-10-CM | POA: Diagnosis not present

## 2024-05-18 DIAGNOSIS — Z8546 Personal history of malignant neoplasm of prostate: Secondary | ICD-10-CM | POA: Diagnosis not present

## 2024-07-10 DIAGNOSIS — R339 Retention of urine, unspecified: Secondary | ICD-10-CM | POA: Diagnosis not present

## 2024-07-30 DIAGNOSIS — N3081 Other cystitis with hematuria: Secondary | ICD-10-CM | POA: Diagnosis not present

## 2024-07-30 DIAGNOSIS — R339 Retention of urine, unspecified: Secondary | ICD-10-CM | POA: Diagnosis not present

## 2024-08-04 DIAGNOSIS — R339 Retention of urine, unspecified: Secondary | ICD-10-CM | POA: Diagnosis not present

## 2024-08-04 DIAGNOSIS — R935 Abnormal findings on diagnostic imaging of other abdominal regions, including retroperitoneum: Secondary | ICD-10-CM | POA: Diagnosis not present

## 2024-08-04 DIAGNOSIS — N3081 Other cystitis with hematuria: Secondary | ICD-10-CM | POA: Diagnosis not present

## 2024-08-11 DIAGNOSIS — R972 Elevated prostate specific antigen [PSA]: Secondary | ICD-10-CM | POA: Diagnosis not present

## 2024-08-11 DIAGNOSIS — E785 Hyperlipidemia, unspecified: Secondary | ICD-10-CM | POA: Diagnosis not present

## 2024-08-17 DIAGNOSIS — Z1212 Encounter for screening for malignant neoplasm of rectum: Secondary | ICD-10-CM | POA: Diagnosis not present

## 2024-08-17 DIAGNOSIS — Z0000 Encounter for general adult medical examination without abnormal findings: Secondary | ICD-10-CM | POA: Diagnosis not present

## 2024-08-17 DIAGNOSIS — Z524 Kidney donor: Secondary | ICD-10-CM | POA: Diagnosis not present

## 2024-08-17 DIAGNOSIS — Z1339 Encounter for screening examination for other mental health and behavioral disorders: Secondary | ICD-10-CM | POA: Diagnosis not present

## 2024-08-17 DIAGNOSIS — C61 Malignant neoplasm of prostate: Secondary | ICD-10-CM | POA: Diagnosis not present

## 2024-08-17 DIAGNOSIS — N1831 Chronic kidney disease, stage 3a: Secondary | ICD-10-CM | POA: Diagnosis not present

## 2024-08-17 DIAGNOSIS — R82998 Other abnormal findings in urine: Secondary | ICD-10-CM | POA: Diagnosis not present

## 2024-08-17 DIAGNOSIS — Z1331 Encounter for screening for depression: Secondary | ICD-10-CM | POA: Diagnosis not present

## 2024-08-17 DIAGNOSIS — E785 Hyperlipidemia, unspecified: Secondary | ICD-10-CM | POA: Diagnosis not present

## 2024-09-15 DIAGNOSIS — R300 Dysuria: Secondary | ICD-10-CM | POA: Diagnosis not present
